# Patient Record
Sex: Male | Born: 1985 | Hispanic: Yes | Marital: Single | Smoking: Never smoker
Health system: Southern US, Community
[De-identification: ages and names within clinical notes are randomized; demographics above are authoritative.]

---

## 2022-02-02 ENCOUNTER — Emergency Department (HOSPITAL_COMMUNITY)
Admission: EM | Admit: 2022-02-02 | Discharge: 2022-02-02 | Disposition: A | Discharge status: Home / Self Care | Payer: Self-pay | Attending: Emergency Medicine | Admitting: Emergency Medicine

## 2022-02-02 ENCOUNTER — Emergency Department (HOSPITAL_COMMUNITY): Payer: Self-pay

## 2022-02-02 ENCOUNTER — Encounter (HOSPITAL_COMMUNITY): Payer: Self-pay

## 2022-02-02 ENCOUNTER — Other Ambulatory Visit: Payer: Self-pay

## 2022-02-02 DIAGNOSIS — Y93E5 Activity, floor mopping and cleaning: Secondary | ICD-10-CM | POA: Insufficient documentation

## 2022-02-02 DIAGNOSIS — W01198A Fall on same level from slipping, tripping and stumbling with subsequent striking against other object, initial encounter: Secondary | ICD-10-CM | POA: Insufficient documentation

## 2022-02-02 DIAGNOSIS — S62324A Displaced fracture of shaft of fourth metacarpal bone, right hand, initial encounter for closed fracture: Secondary | ICD-10-CM | POA: Insufficient documentation
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
## 2022-02-02 IMAGING — CR DG FOREARM 2V*R*
2 series · 2 of 2 positions shown · non-contrast
Comparison: None Available.

CLINICAL DATA: pain fall

EXAM:
RIGHT FOREARM - 2 VIEW

[forearm ap]
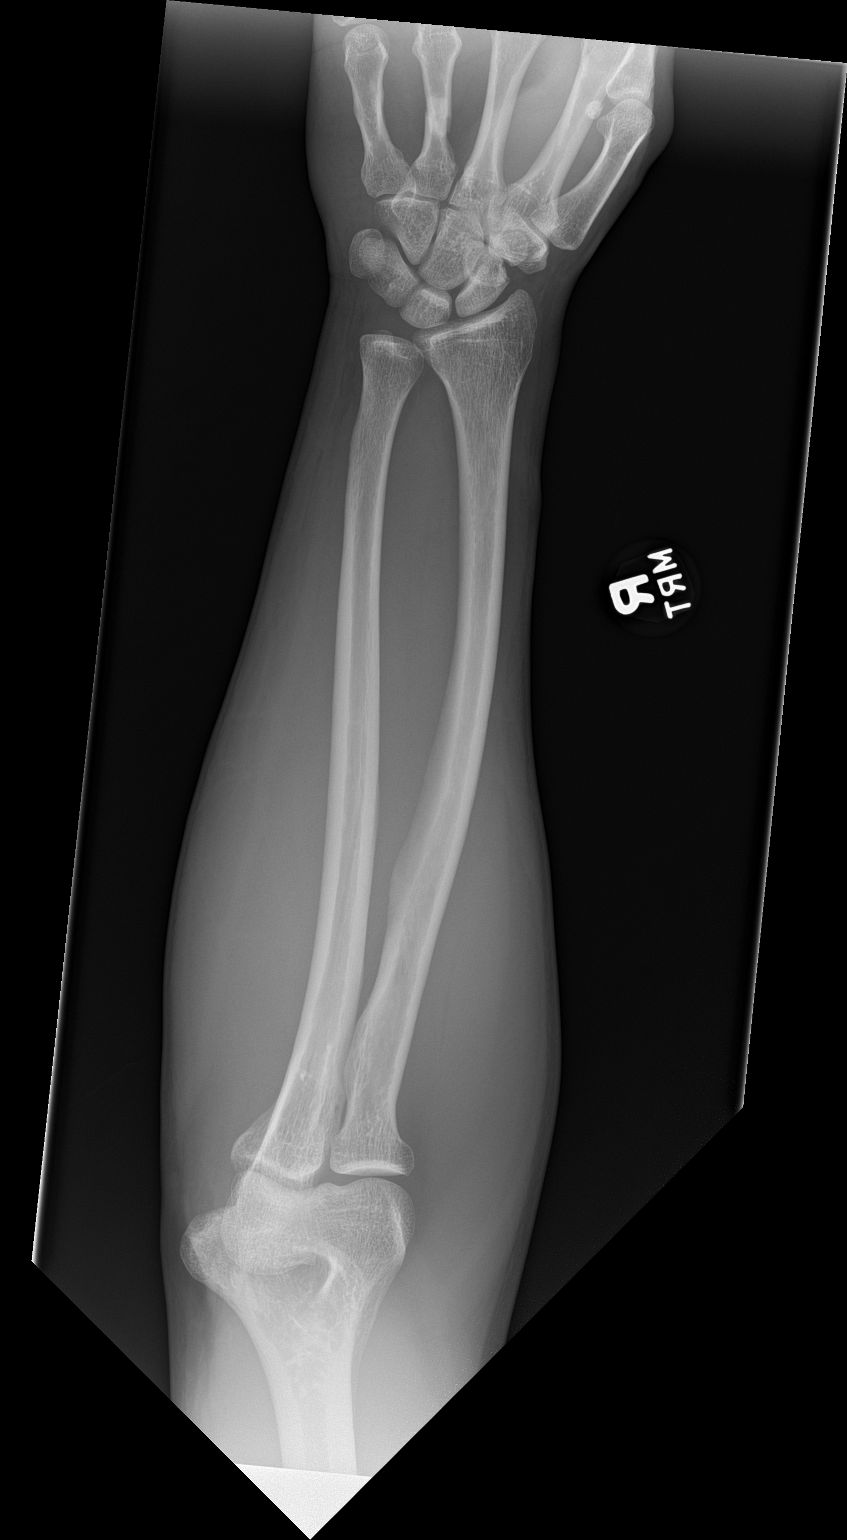

[forearm lat]
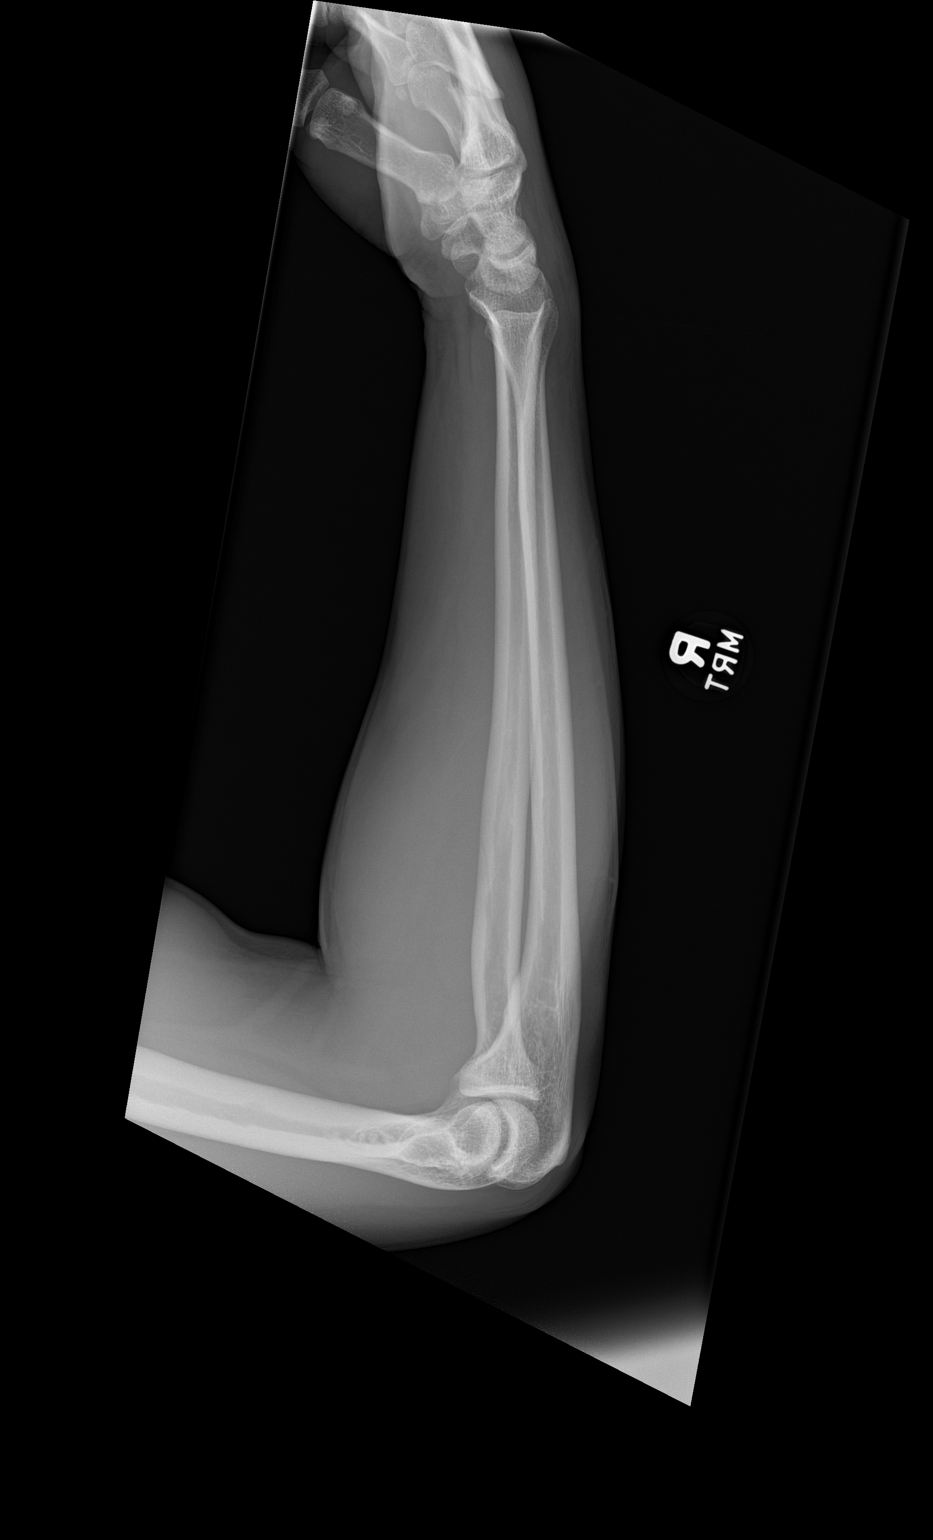

[2 of 2 positions shown; findings below may reference images not displayed]

FINDINGS: There is no evidence of fracture or other focal bone lesions in the
forearm. There is fracture of the fourth metacarpal with some
overlapping seen in the partially visualized hand. Soft tissues are
unremarkable.
IMPRESSION: Radius and ulna have a normal appearance. There is a fracture of the
fourth metacarpal.

## 2022-02-02 IMAGING — CR DG HAND COMPLETE 3+V*R*
3 series · 3 of 3 positions shown · non-contrast
Comparison: None Available.

CLINICAL DATA: Fall, right hand pain and swelling

EXAM:
RIGHT HAND - COMPLETE 3+ VIEW

[hand pa]
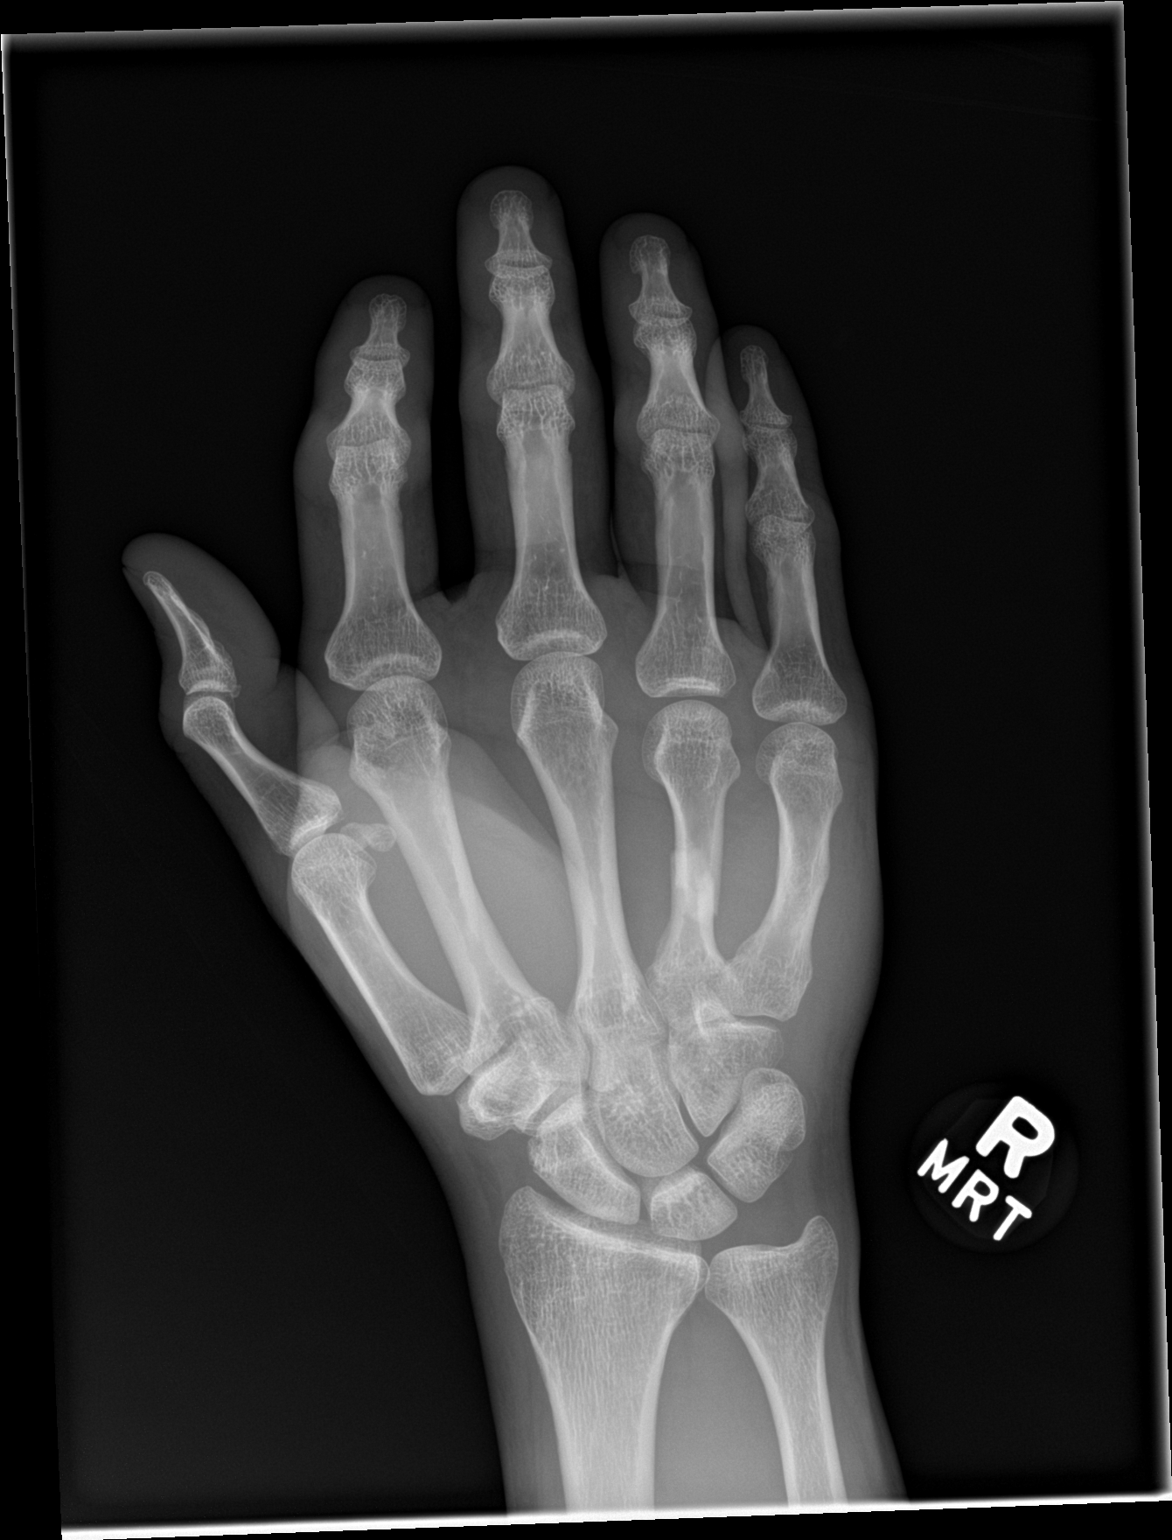

[hand obl]
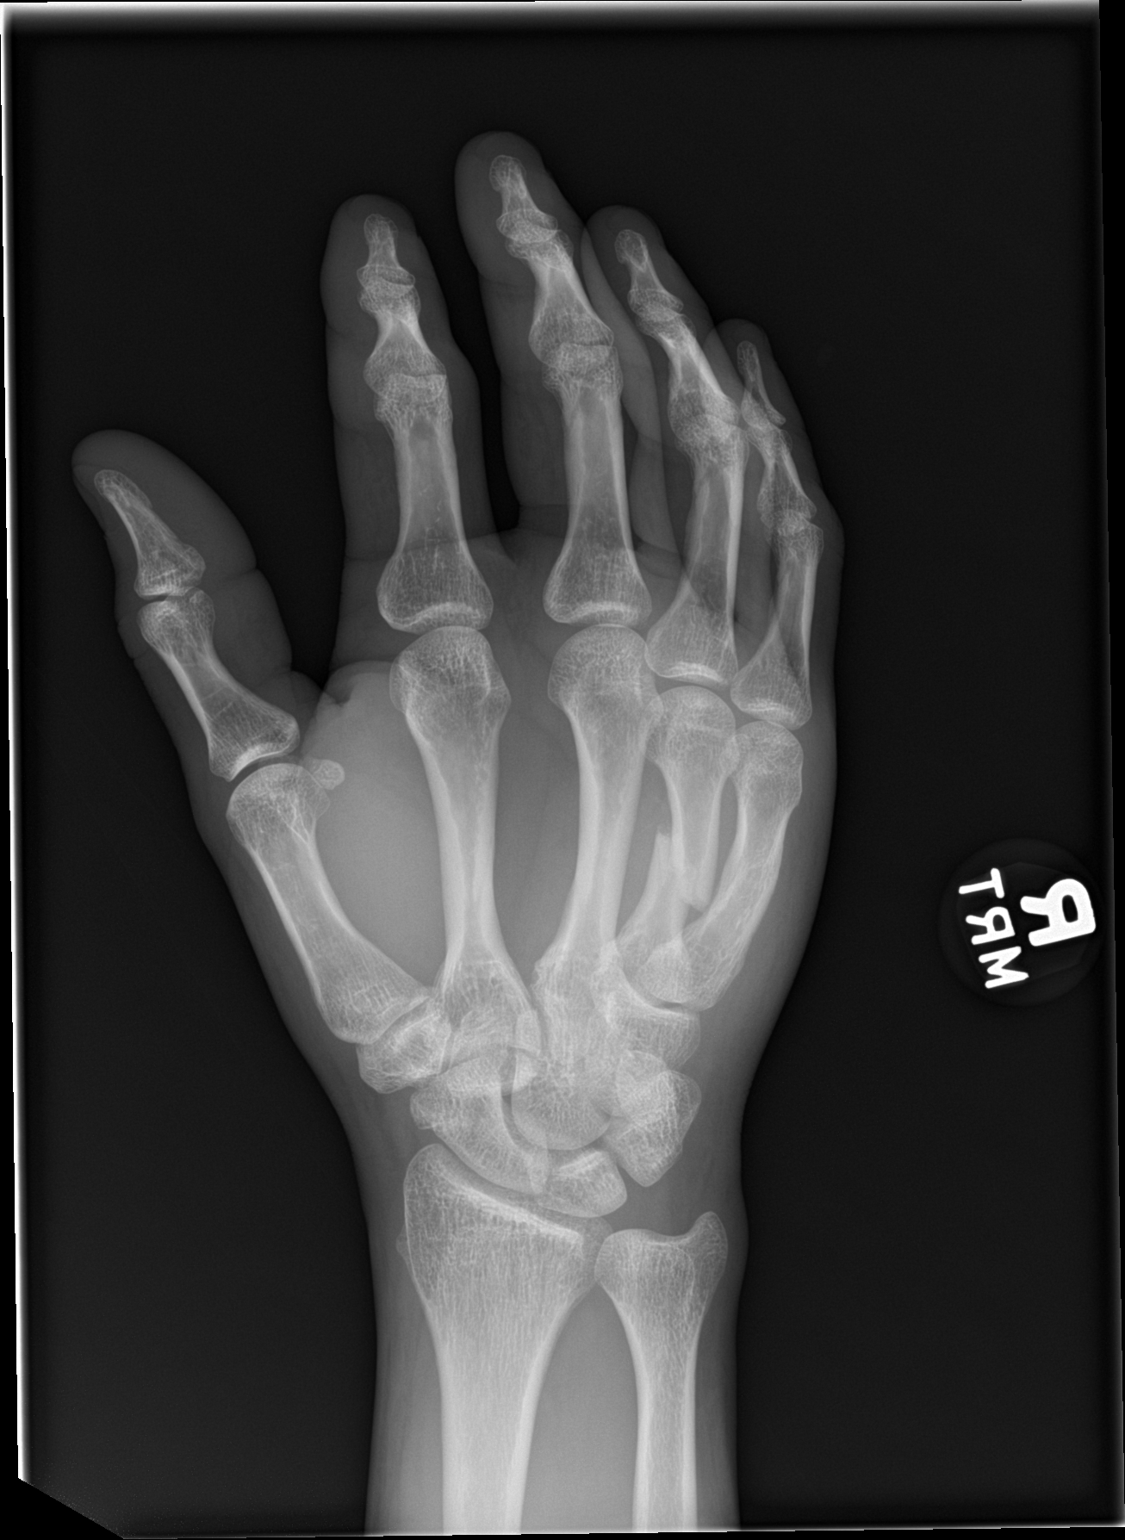

[hand lat]
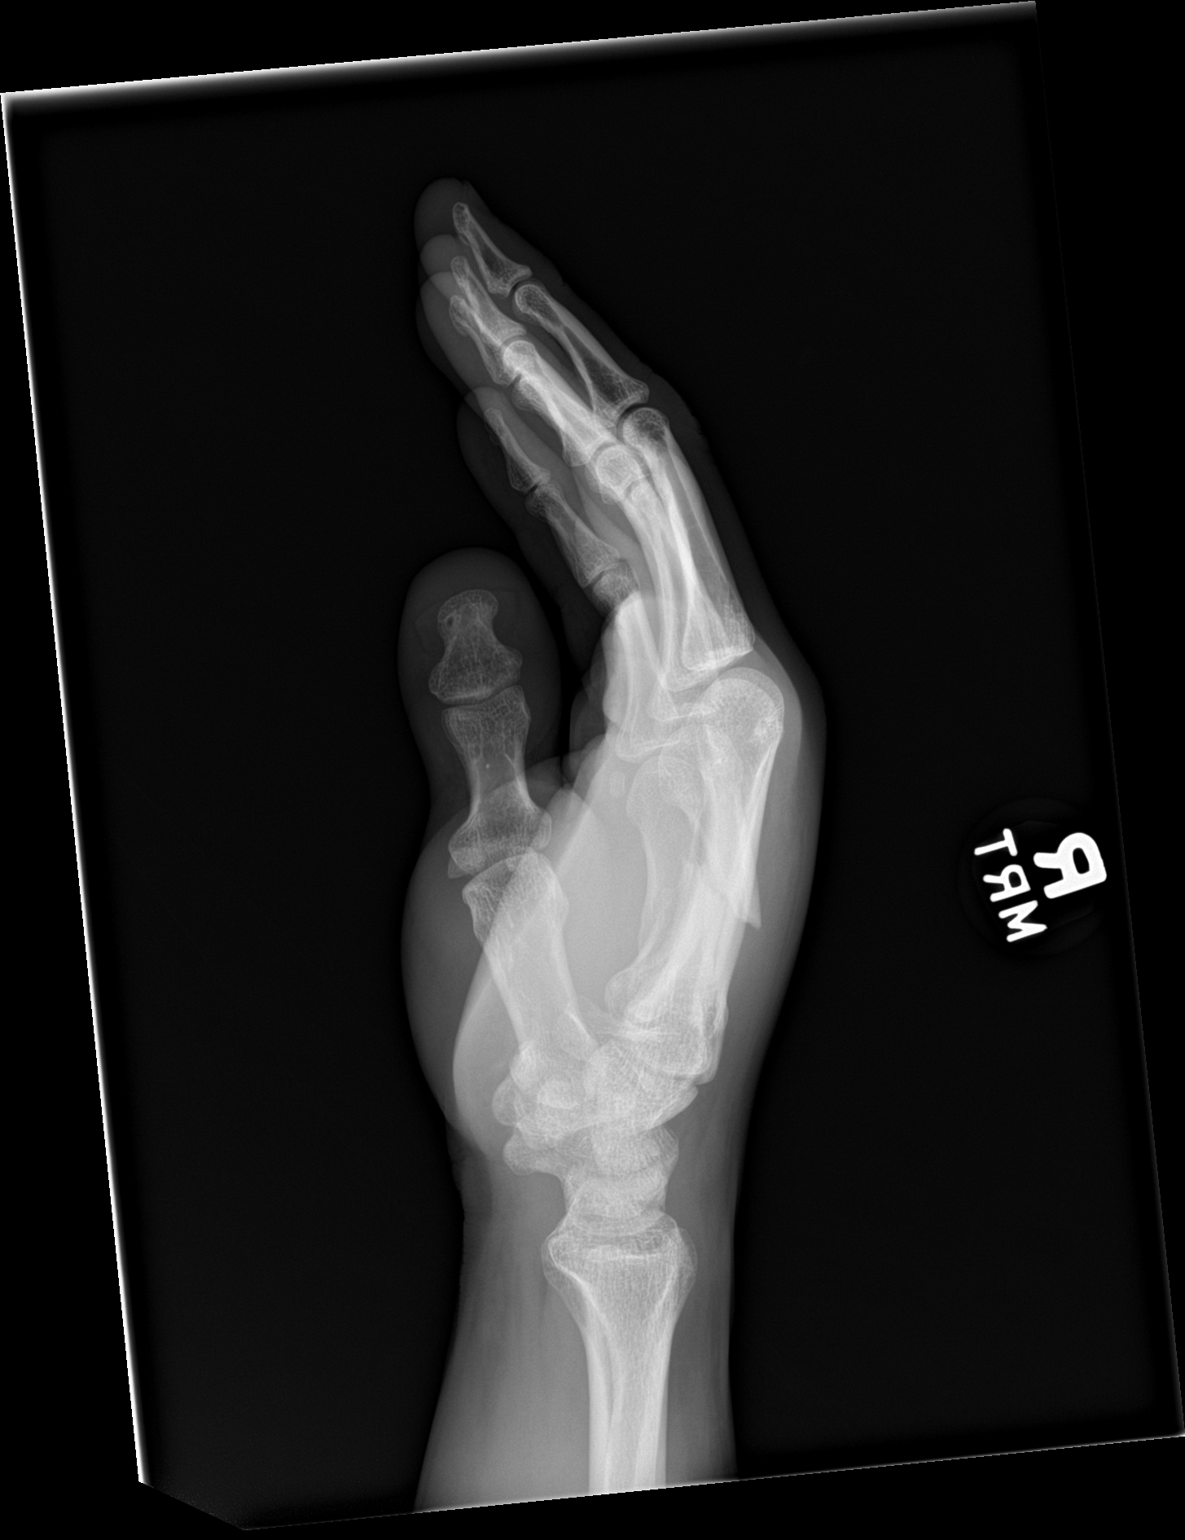

[3 of 3 positions shown; findings below may reference images not displayed]

FINDINGS: Fracture of the mid right fourth metacarpal, which is moderately
displaced and foreshortened. No additional fracture is seen. Soft
tissue swelling about the hand.
IMPRESSION: Fracture of the mid right fourth metacarpal, which is moderately
displaced and foreshortened.
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
## 2022-02-02 NOTE — Discharge Instructions (Addendum)
Return for any problem.  ? ?You have a fracture of the fourth metacarpal of your right hand.  This requires close follow-up with hand specialist to make sure that the bone heals correctly. ? ?The splint should remain in place until you can follow-up with a hand specialist (Dr. Lenon Curt). ? ?It is important to keep your splint dry.  Elevate your hand.  Take Tylenol for pain. ? ? ? ? ?

## 2022-02-02 NOTE — ED Triage Notes (Signed)
Pt arrived POV from home c/o a right wrist/hand injury. Pt states he was on the roof cleaning out the gutters and fell off onto his right arm and used his hand to catch himself. Pt has obvious swelling to right hand and bruising.  ?
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
## 2022-02-02 NOTE — ED Notes (Signed)
Ortho at hallway side  ?

## 2022-02-02 NOTE — ED Provider Triage Note (Signed)
Emergency Medicine Provider Triage Evaluation Note ? ?Joshua Orozco , a 36 y.o. male  was evaluated in triage.  Pt complains of head pain, neck pain and right hand pain after a fall earlier today.  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  Denies any loss of consciousness, anticoagulant use.  His pain is located on the dorsal side of the right hand with swelling. ? ?Review of Systems  ?Positive: Headache, neck pain, hand pain ?Negative: Loss Of consciousness ? ?Physical Exam  ?BP 114/71 (BP Location: Right Arm)   Pulse 70   Temp (!) 97.5 ?F (36.4 ?C) (Oral)   Resp 16   SpO2 96%  ?Gen:   Awake, no distress   ?Resp:  Normal effort  ?MSK:   Moves extremities without difficulty  ?Other:  Edema of the dorsum of the right hand.  2+ radial pulse palpated bilaterally.  Tenderness palpation of the cervical spine at the midline paraspinal musculature.  No facial asymmetry.  Moving all extremities without difficulty.  Normal gait. ? ?Medical Decision Making  ?Medically screening exam initiated at 2:57 PM.  Appropriate orders placed.  Joshua Orozco was informed that the remainder of the evaluation will be completed by another provider, this initial triage assessment does not replace that evaluation, and the importance of remaining in the ED until their evaluation is complete. ? ?Imaging ordered ?  ?Dietrich Pates, PA-C ?02/02/22 1506 ? ?

## 2022-02-22 ENCOUNTER — Inpatient Hospital Stay (HOSPITAL_COMMUNITY)
Admission: EM | Admit: 2022-02-22 | Discharge: 2022-03-07 | DRG: 958 | Disposition: A | Discharge status: Rehabilitation Facility | Payer: Self-pay | Attending: General Surgery | Admitting: General Surgery

## 2022-02-22 ENCOUNTER — Encounter (HOSPITAL_COMMUNITY): Payer: Self-pay

## 2022-02-22 ENCOUNTER — Emergency Department (HOSPITAL_COMMUNITY): Payer: Self-pay

## 2022-02-22 ENCOUNTER — Inpatient Hospital Stay (HOSPITAL_COMMUNITY): Payer: Self-pay

## 2022-02-22 ENCOUNTER — Other Ambulatory Visit: Payer: Self-pay

## 2022-02-22 DIAGNOSIS — S62396A Other fracture of fifth metacarpal bone, right hand, initial encounter for closed fracture: Secondary | ICD-10-CM | POA: Diagnosis present

## 2022-02-22 DIAGNOSIS — S3210XA Unspecified fracture of sacrum, initial encounter for closed fracture: Secondary | ICD-10-CM | POA: Diagnosis present

## 2022-02-22 DIAGNOSIS — F10129 Alcohol abuse with intoxication, unspecified: Secondary | ICD-10-CM | POA: Diagnosis present

## 2022-02-22 DIAGNOSIS — N501 Vascular disorders of male genital organs: Secondary | ICD-10-CM

## 2022-02-22 DIAGNOSIS — D62 Acute posthemorrhagic anemia: Secondary | ICD-10-CM | POA: Diagnosis present

## 2022-02-22 DIAGNOSIS — S7011XA Contusion of right thigh, initial encounter: Secondary | ICD-10-CM | POA: Diagnosis present

## 2022-02-22 DIAGNOSIS — S62394A Other fracture of fourth metacarpal bone, right hand, initial encounter for closed fracture: Secondary | ICD-10-CM | POA: Diagnosis present

## 2022-02-22 DIAGNOSIS — S32592A Other specified fracture of left pubis, initial encounter for closed fracture: Secondary | ICD-10-CM | POA: Diagnosis present

## 2022-02-22 DIAGNOSIS — T1490XA Injury, unspecified, initial encounter: Principal | ICD-10-CM

## 2022-02-22 DIAGNOSIS — S3729XA Other injury of bladder, initial encounter: Secondary | ICD-10-CM | POA: Diagnosis present

## 2022-02-22 DIAGNOSIS — S32810A Multiple fractures of pelvis with stable disruption of pelvic ring, initial encounter for closed fracture: Secondary | ICD-10-CM

## 2022-02-22 DIAGNOSIS — N3289 Other specified disorders of bladder: Secondary | ICD-10-CM | POA: Diagnosis present

## 2022-02-22 DIAGNOSIS — S6291XA Unspecified fracture of right wrist and hand, initial encounter for closed fracture: Secondary | ICD-10-CM

## 2022-02-22 DIAGNOSIS — Y92410 Unspecified street and highway as the place of occurrence of the external cause: Secondary | ICD-10-CM

## 2022-02-22 LAB — COMPREHENSIVE METABOLIC PANEL
ALT: 19 U/L (ref 0–44)
AST: 33 U/L (ref 15–41)
Albumin: 4.7 g/dL (ref 3.5–5.0)
Alkaline Phosphatase: 62 U/L (ref 38–126)
Anion gap: 11 (ref 5–15)
BUN: 7 mg/dL (ref 6–20)
CO2: 22 mmol/L (ref 22–32)
Calcium: 9.3 mg/dL (ref 8.9–10.3)
Chloride: 105 mmol/L (ref 98–111)
Creatinine, Ser: 0.83 mg/dL (ref 0.61–1.24)
GFR, Estimated: >60 mL/min (ref >60)
Glucose, Bld: 125 mg/dL — ABNORMAL HIGH (ref 70–99)
Potassium: 4 mmol/L (ref 3.5–5.1)
Sodium: 138 mmol/L (ref 135–145)
Total Bilirubin: 0.6 mg/dL (ref 0.3–1.2)
Total Protein: 7.2 g/dL (ref 6.5–8.1)

## 2022-02-22 LAB — URINALYSIS, ROUTINE W REFLEX MICROSCOPIC
Bilirubin Urine: NEGATIVE
Glucose, UA: NEGATIVE mg/dL
Hgb urine dipstick: NEGATIVE
Ketones, ur: 5 mg/dL — AB
Leukocytes,Ua: NEGATIVE
Nitrite: NEGATIVE
Protein, ur: NEGATIVE mg/dL
Specific Gravity, Urine: >1.046 — ABNORMAL HIGH (ref 1.005–1.030)
pH: 5 (ref 5.0–8.0)

## 2022-02-22 LAB — MRSA NEXT GEN BY PCR, NASAL: MRSA by PCR Next Gen: NOT DETECTED

## 2022-02-22 LAB — I-STAT CHEM 8, ED
BUN: 7 mg/dL (ref 6–20)
Calcium, Ion: 1.15 mmol/L (ref 1.15–1.40)
Chloride: 103 mmol/L (ref 98–111)
Creatinine, Ser: 0.9 mg/dL (ref 0.61–1.24)
Glucose, Bld: 123 mg/dL — ABNORMAL HIGH (ref 70–99)
HCT: 43 % (ref 39.0–52.0)
Hemoglobin: 14.6 g/dL (ref 13.0–17.0)
Potassium: 4 mmol/L (ref 3.5–5.1)
Sodium: 140 mmol/L (ref 135–145)
TCO2: 22 mmol/L (ref 22–32)

## 2022-02-22 LAB — ETHANOL: Alcohol, Ethyl (B): 129 mg/dL — ABNORMAL HIGH (ref <10)

## 2022-02-22 LAB — CBC
HCT: 40.8 % (ref 39.0–52.0)
Hemoglobin: 14.3 g/dL (ref 13.0–17.0)
MCH: 31.2 pg (ref 26.0–34.0)
MCHC: 35 g/dL (ref 30.0–36.0)
MCV: 88.9 fL (ref 80.0–100.0)
Platelets: 189 10*3/uL (ref 150–400)
RBC: 4.59 MIL/uL (ref 4.22–5.81)
RDW: 12.5 % (ref 11.5–15.5)
WBC: 13.3 10*3/uL — ABNORMAL HIGH (ref 4.0–10.5)
nRBC: 0 % (ref 0.0–0.2)

## 2022-02-22 LAB — PROTIME-INR
INR: 1 (ref 0.8–1.2)
Prothrombin Time: 13.1 seconds (ref 11.4–15.2)

## 2022-02-22 LAB — SAMPLE TO BLOOD BANK

## 2022-02-22 LAB — HIV ANTIBODY (ROUTINE TESTING W REFLEX): HIV Screen 4th Generation wRfx: NONREACTIVE
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
## 2022-02-22 IMAGING — MR MR THORACIC SPINE W/O CM
4 of 6 series · 20 of 48 positions shown · non-contrast
Comparison: Prior CT from earlier the same day.

CLINICAL DATA: Initial evaluation for acute back trauma, abnormal
neuro exam.

EXAM:
MRI THORACIC SPINE WITHOUT CONTRAST
TECHNIQUE: Multiplanar, multisequence MR imaging of the thoracic spine was
performed. No intravenous contrast was administered.

[Series 18: T1 · sagittal · 3.3mm · 0.62mm/px · 3 of 9 slices shown (1 of 2)]
[im 1/9]
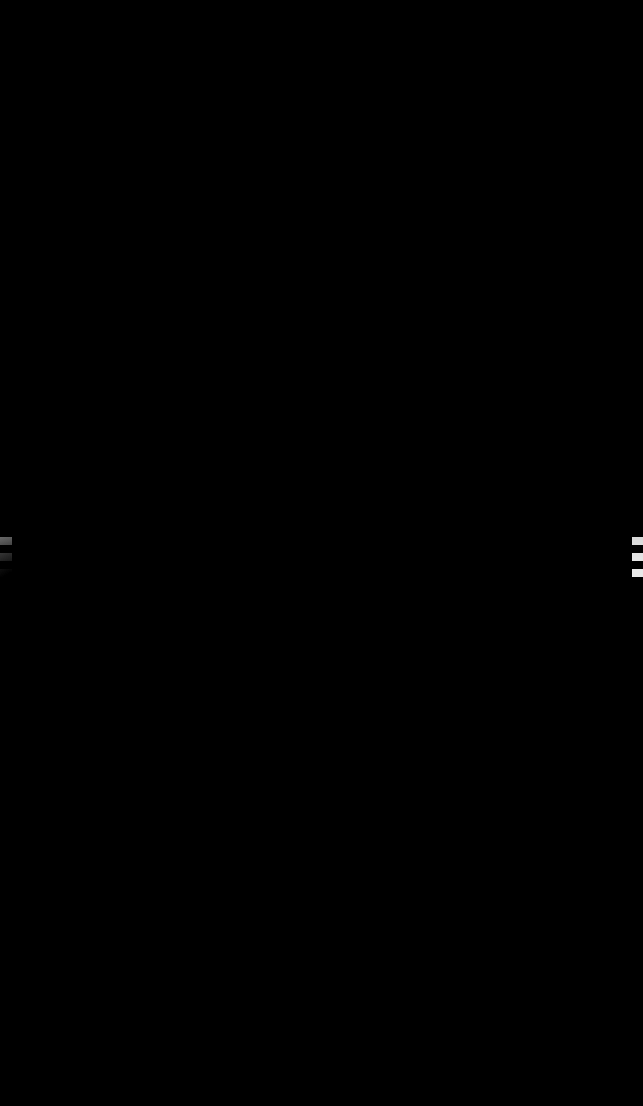
[im 6/9]
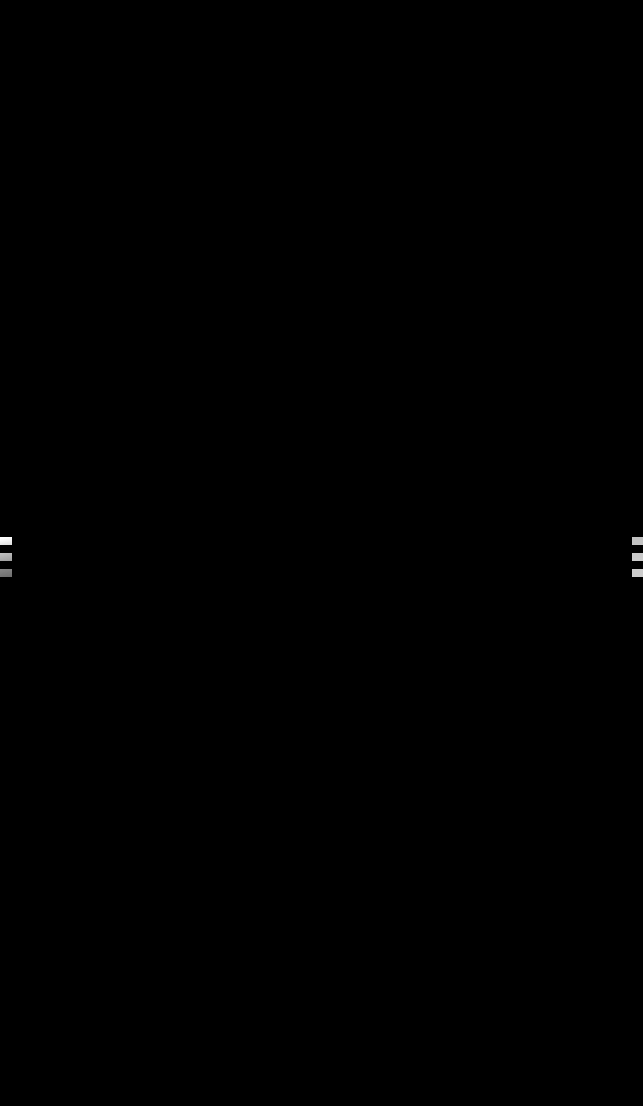
[im 9/9]
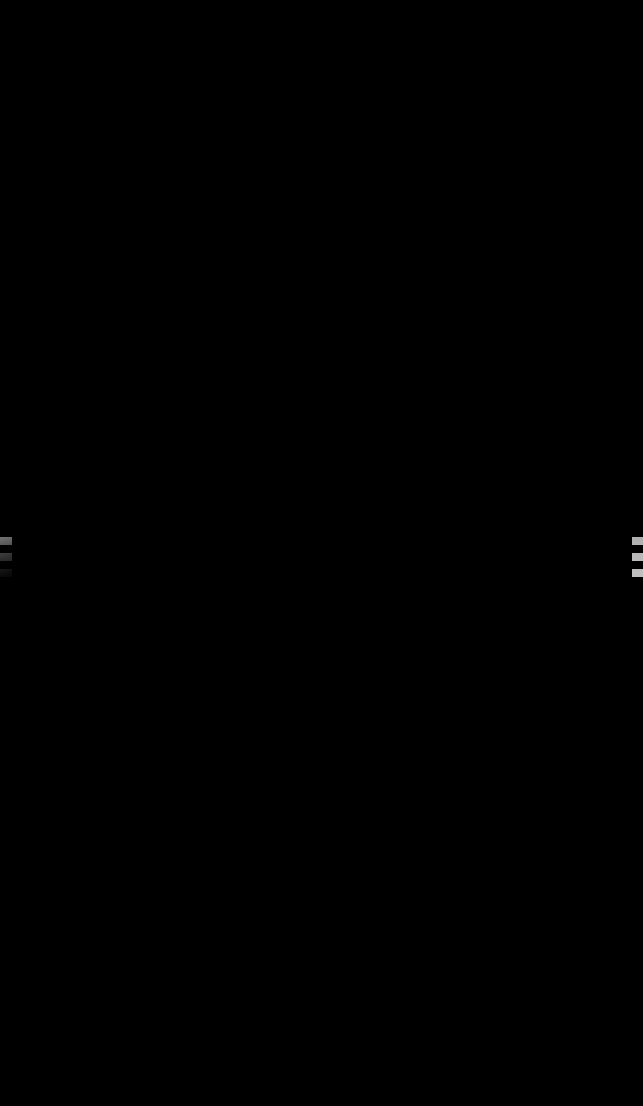

[Series 19: T2 · sagittal · 3.0mm · 0.76mm/px · 6 of 15 slices shown (1 of 2)]
[im 1/15]
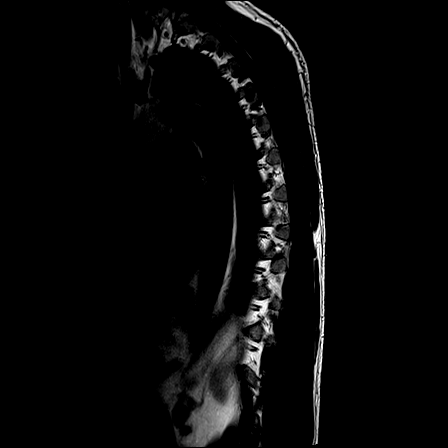
[im 3/15]
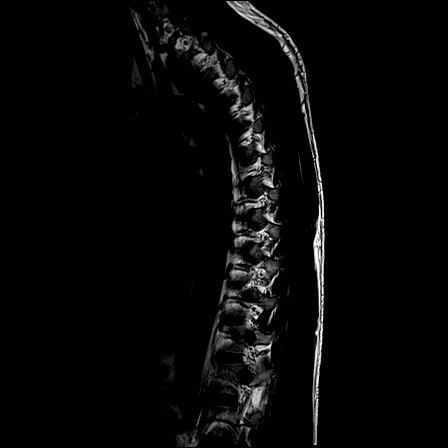
[im 6/15]
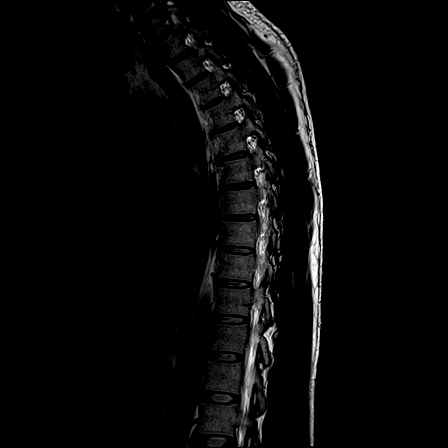
[im 9/15]
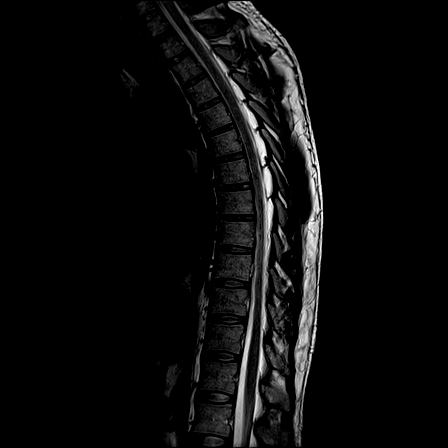
[im 12/15]
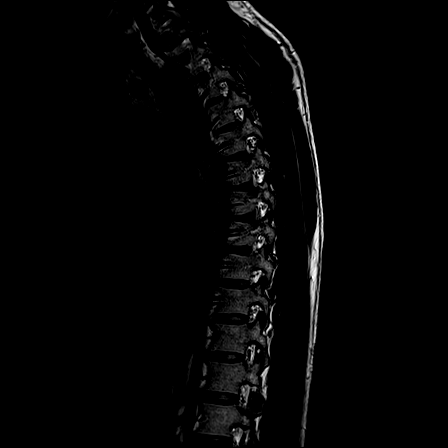
[im 15/15]
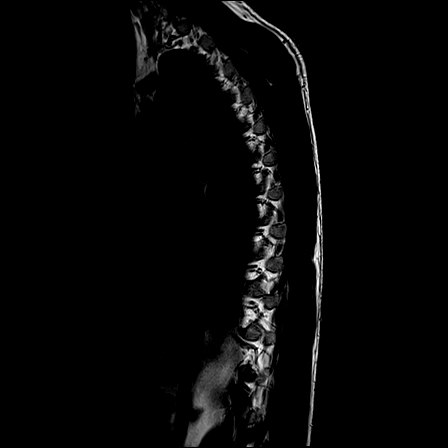

[Series 20: T1 · sagittal · 3.0mm · 0.76mm/px · 3 of 15 slices shown (2 of 2)]
[im 1/15]
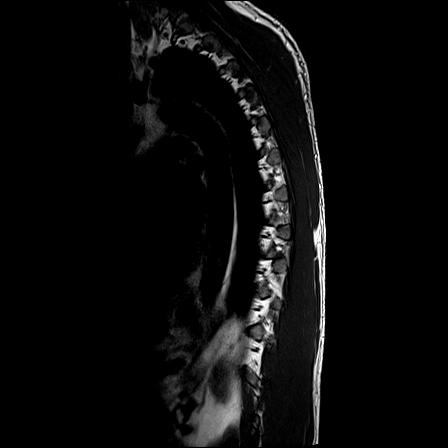
[im 8/15]
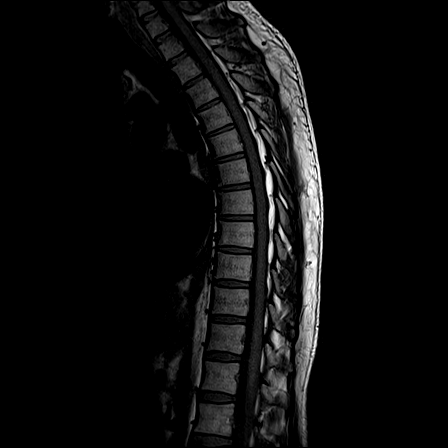
[im 15/15]
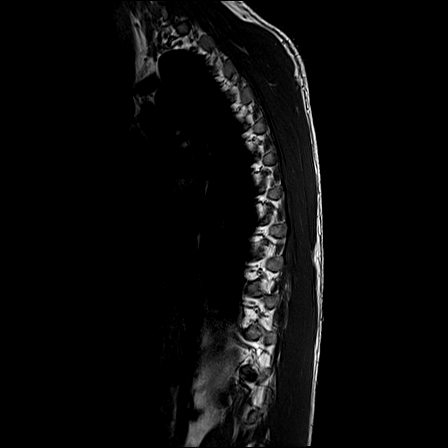

[Series 22: T2 · axial · 5.0mm · 0.59mm/px · z∈[-326,-98]mm · 8 of 39 slices shown (2 of 2)]
[im 1/39]
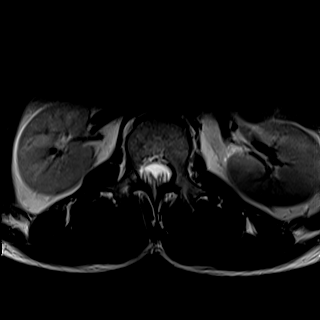
[im 6/39]
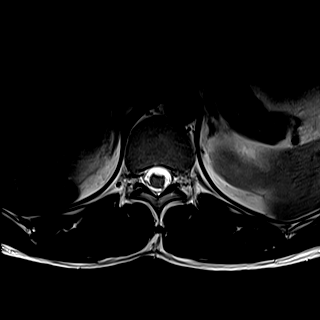
[im 12/39]
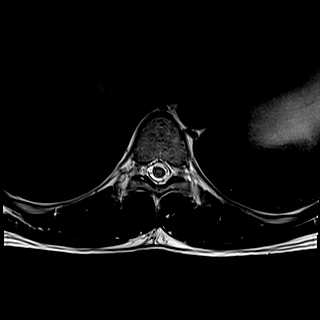
[im 18/39]
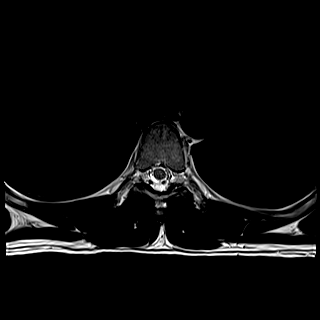
[im 21/39]
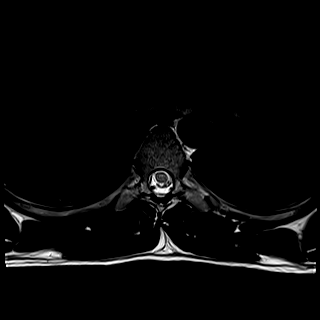
[im 27/39]
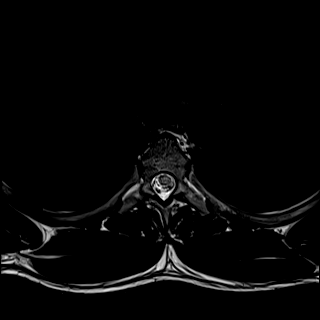
[im 33/39]
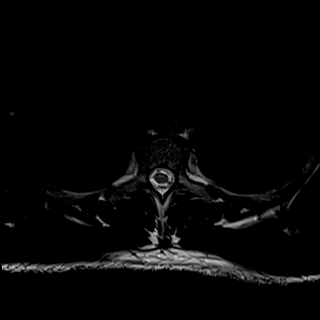
[im 39/39]
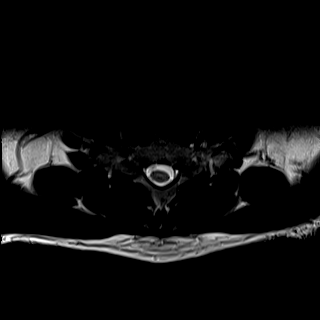

[20 of 48 positions shown; findings below may reference images not displayed]

FINDINGS: Alignment: Physiologic with preservation of the normal thoracic
kyphosis. No listhesis.

Vertebrae: Vertebral body height maintained without acute or chronic
fracture. Bone marrow signal intensity within normal limits. No
discrete or worrisome osseous lesions. No abnormal marrow edema.

Cord: Normal signal and morphology. No epidural collections. No
evidence for ligamentous injury.

Paraspinal and other soft tissues: Paraspinous soft tissues
demonstrate no acute finding. Subcentimeter simple cyst noted within
the left kidney, benign in appearance, no follow-up imaging
recommended.

Disc levels:

No significant disc pathology.  No stenosis or neural impingement.
IMPRESSION: Normal MRI of the thoracic spine. No evidence for acute traumatic
injury.
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
## 2022-02-22 IMAGING — MR MR LUMBAR SPINE W/O CM
4 of 5 series · 28 of 48 positions shown · non-contrast
Comparison: Prior CT from earlier the same day.

CLINICAL DATA: Initial evaluation for acute trauma, abnormal neuro
exam.

EXAM:
MRI LUMBAR SPINE WITHOUT CONTRAST
TECHNIQUE: Multiplanar, multisequence MR imaging of the lumbar spine was
performed. No intravenous contrast was administered.

[Series 1: T2 · sagittal · 4.0mm · 0.73mm/px · 8 of 15 slices shown (1 of 2)]
[im 1/15]
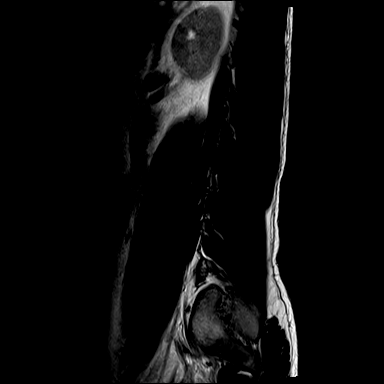
[im 3/15]
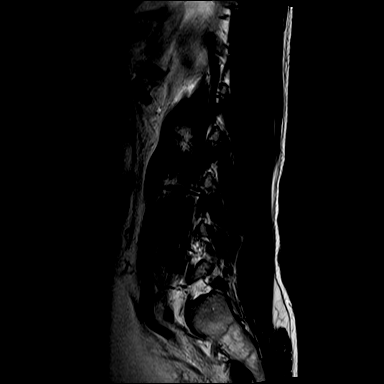
[im 5/15]
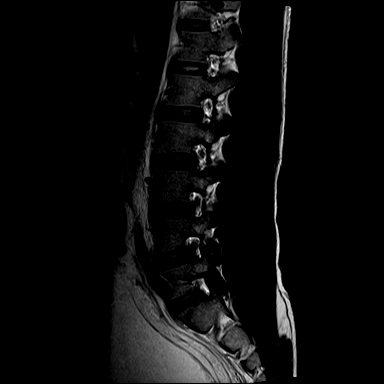
[im 7/15]
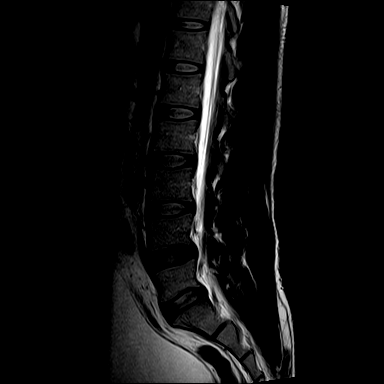
[im 9/15]
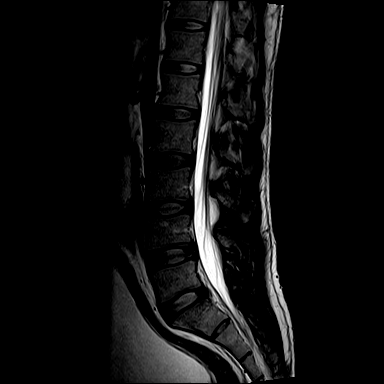
[im 11/15]
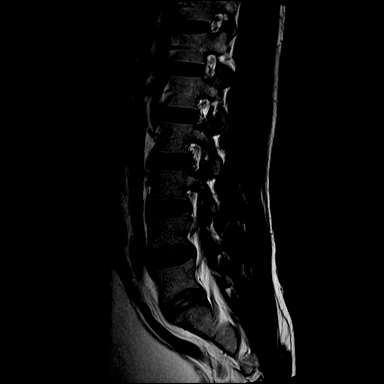
[im 13/15]
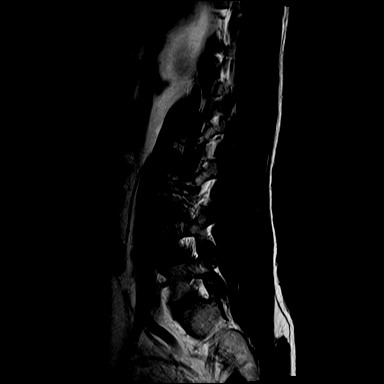
[im 15/15]
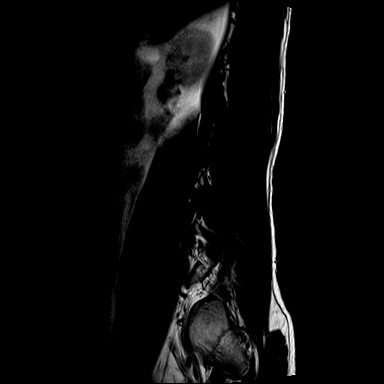

[Series 3: T1 · sagittal · 4.0mm · 0.88mm/px · 7 of 15 slices shown (1 of 2)]
[im 1/15]
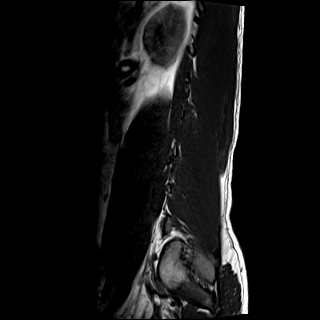
[im 3/15]
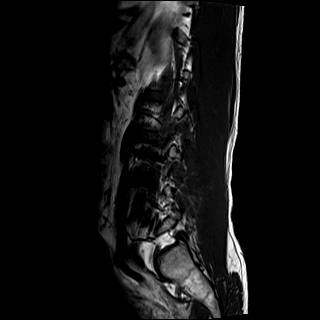
[im 5/15]
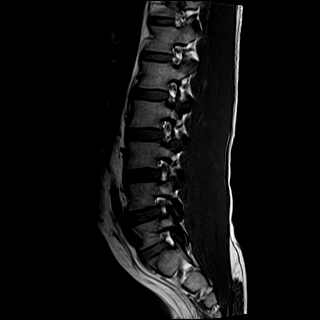
[im 8/15]
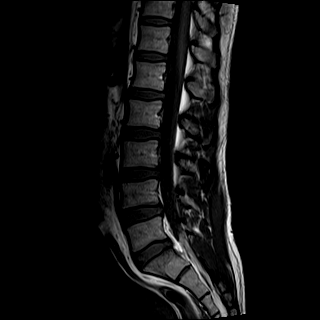
[im 10/15]
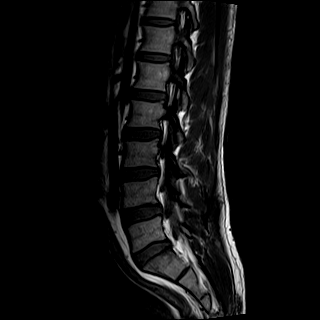
[im 12/15]
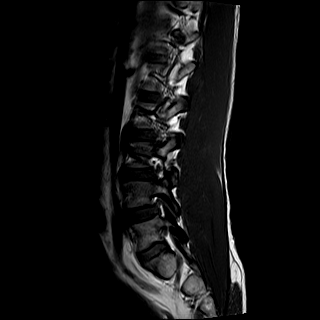
[im 15/15]
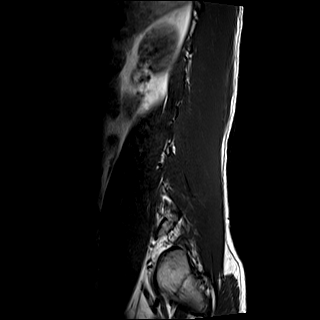

[Series 4: T2 · axial · 5.0mm · 0.57mm/px · z∈[-510,-315]mm · 9 of 26 slices shown (2 of 2)]
[im 1/26]
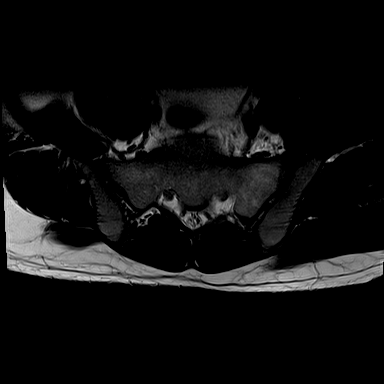
[im 5/26]
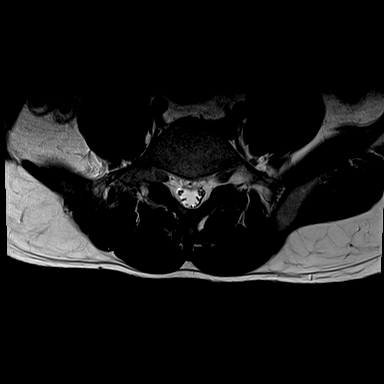
[im 9/26]
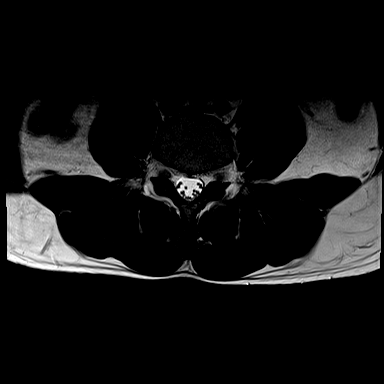
[im 11/26]
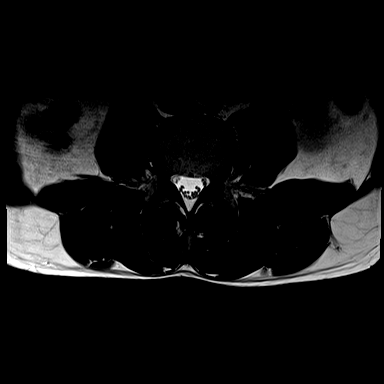
[im 13/26]
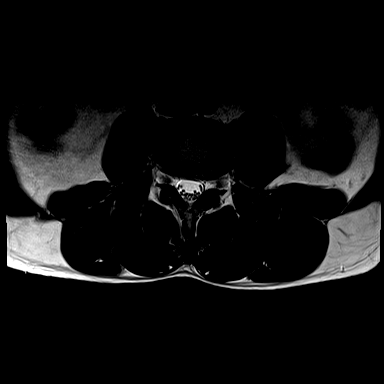
[im 15/26]
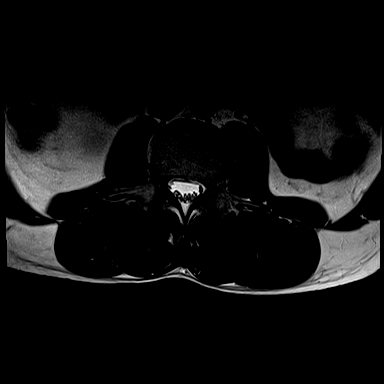
[im 17/26]
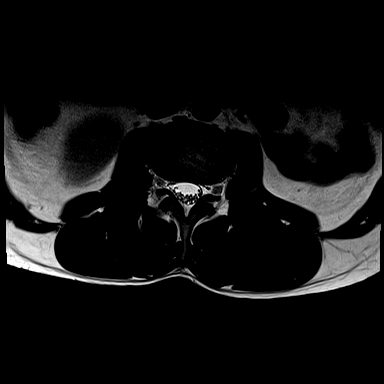
[im 21/26]
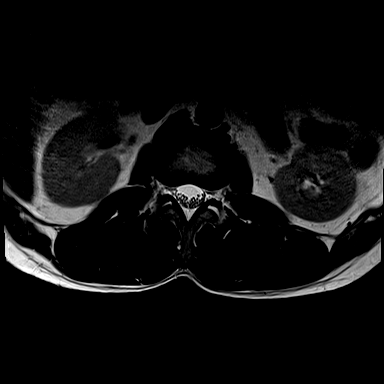
[im 26/26]
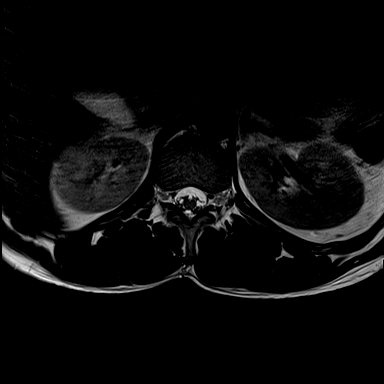

[Series 5: T1 · axial · 5.0mm · 0.34mm/px · z∈[-510,-352]mm · 4 of 26 slices shown (2 of 2)]
[im 1/26]
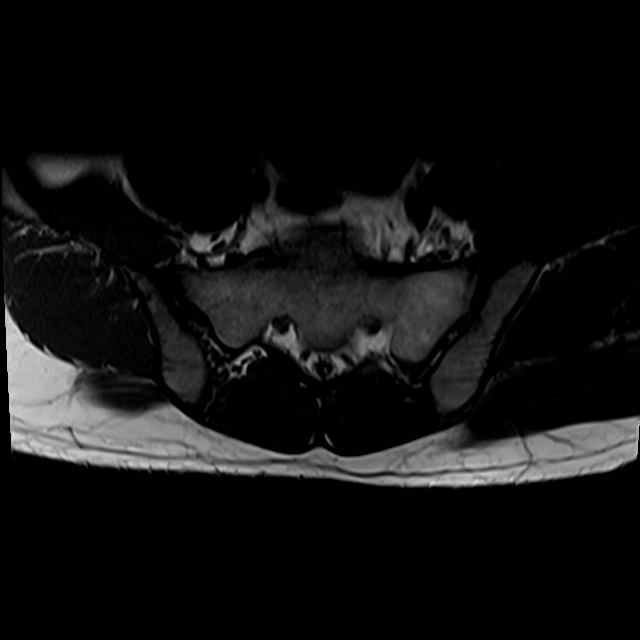
[im 5/26]
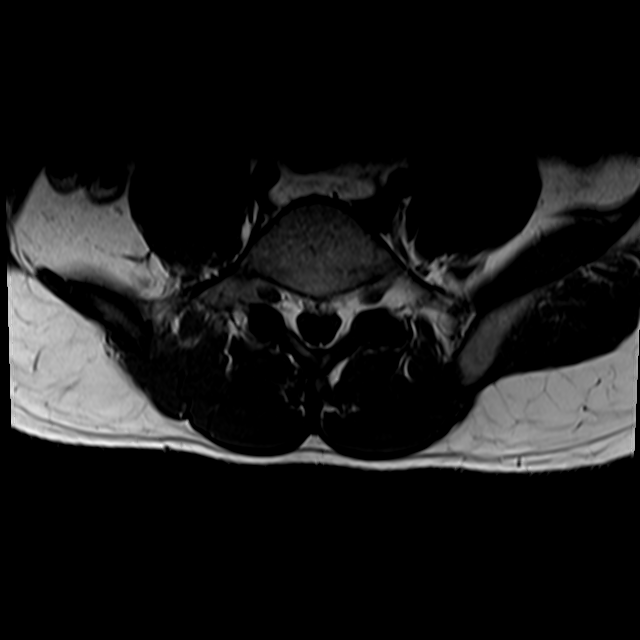
[im 13/26]
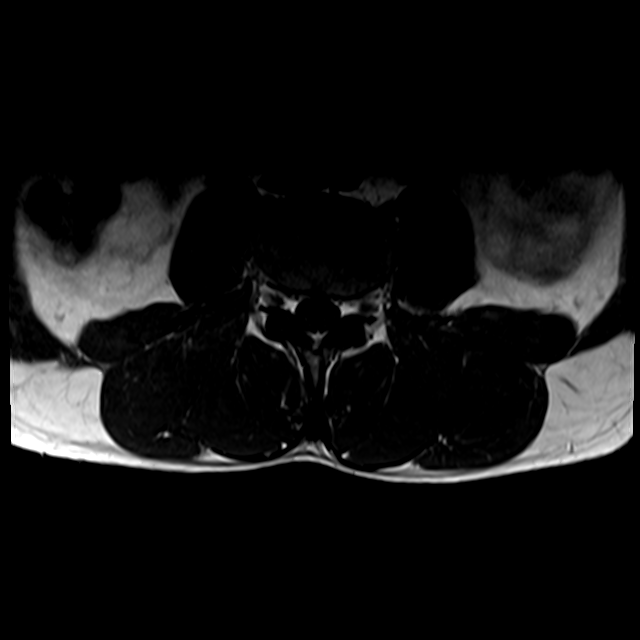
[im 21/26]
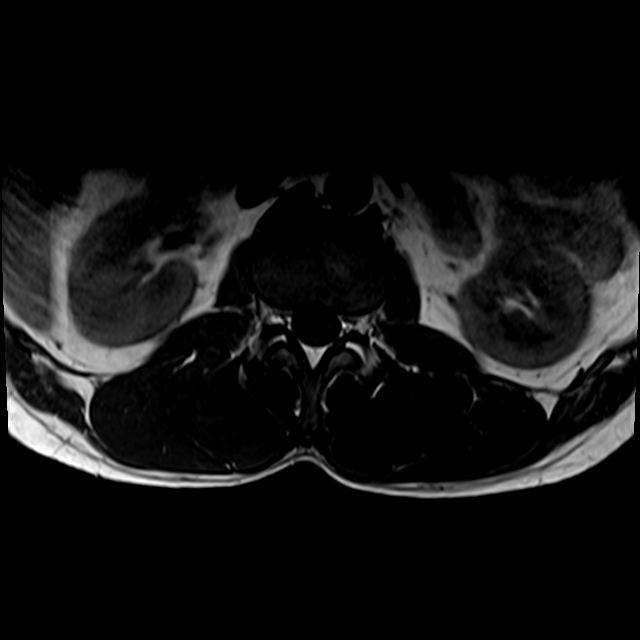

[28 of 48 positions shown; findings below may reference images not displayed]

FINDINGS: Segmentation: Standard. Lowest well-formed disc space labeled the
L5-S1 level.

Alignment: Physiologic with preservation of the normal lumbar
lordosis. No significant listhesis.

Vertebrae: Subtle acute nondisplaced fracture extending through the
mid sacrum with associated marrow edema, better appreciated on prior
CT. Additionally, there are acute nondisplaced fractures of the
bilateral transverse processes of L5, also seen on prior CT in
retrospect. Vertebral body height maintained with no other acute or
chronic fracture. Underlying bone marrow signal intensity within
normal limits. No discrete or worrisome osseous lesions. No other
abnormal marrow edema.

Conus medullaris and cauda equina: Conus extends to the L1 level.
Conus and cauda equina appear normal. Incidental note made of a tiny
fatty filum terminale.

Paraspinal and other soft tissues: Mild edema adjacent to the acute
L5 transverse process fractures. Paraspinous soft tissues
demonstrate no other acute finding. Prominent distension of the
partially visualized urinary bladder. Visualized visceral structures
otherwise unremarkable.

Disc levels:

L1-2:  Unremarkable.

L2-3:  Unremarkable.

L3-4:  Unremarkable.

L4-5: Mild disc bulge. No spinal stenosis. Foramina remain patent.

L5-S1:  Unremarkable.
IMPRESSION: 1. Acute nondisplaced fracture extending through the mid sacrum,
better appreciated on prior CT.
2. Acute nondisplaced fractures of the bilateral transverse
processes of L5, also seen on prior CT in retrospect.
3. No other acute traumatic injury within the lumbar spine. No
significant stenosis or neural impingement.
4. Prominent distension of the partially visualized urinary bladder.
Clinical correlation for possible urinary retention recommended.
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
## 2022-02-22 MED ORDER — METHOCARBAMOL 500 MG PO TABS
500.0000 mg | ORAL_TABLET | Freq: Three times a day (TID) | ORAL | Status: DC | PRN
Start: 2022-02-22 — End: 2022-02-23
  Administered 2022-02-22: 500 mg via ORAL
  Filled 2022-02-22: qty 1

## 2022-02-22 MED ORDER — MORPHINE SULFATE (PF) 4 MG/ML IV SOLN
4.0000 mg | INTRAVENOUS | Status: DC | PRN
  Administered 2022-02-22: 4 mg via INTRAVENOUS
  Filled 2022-02-22 (×2): qty 1

## 2022-02-22 MED ORDER — HYDROMORPHONE HCL 1 MG/ML IJ SOLN
1.0000 mg | INTRAMUSCULAR | Status: DC | PRN
  Administered 2022-02-22: 1 mg via INTRAVENOUS
  Filled 2022-02-22 (×2): qty 1

## 2022-02-22 MED ORDER — OXYCODONE HCL 5 MG PO TABS
5.0000 mg | ORAL_TABLET | ORAL | Status: DC | PRN
  Administered 2022-02-22 – 2022-02-25 (×9): 10 mg via ORAL
  Filled 2022-02-22 (×10): qty 2

## 2022-02-22 MED ORDER — METOPROLOL TARTRATE 5 MG/5ML IV SOLN: 5.0000 mg | Freq: Four times a day (QID) | INTRAVENOUS | Status: DC | PRN

## 2022-02-22 MED ORDER — DOCUSATE SODIUM 100 MG PO CAPS
100.0000 mg | ORAL_CAPSULE | Freq: Two times a day (BID) | ORAL | Status: DC
  Administered 2022-02-22 – 2022-03-05 (×19): 100 mg via ORAL
  Filled 2022-02-22 (×21): qty 1

## 2022-02-22 MED ORDER — IOHEXOL 300 MG/ML  SOLN
100.0000 mL | Freq: Once | INTRAMUSCULAR | Status: AC | PRN
  Administered 2022-02-22: 100 mL via INTRAVENOUS

## 2022-02-22 MED ORDER — ONDANSETRON 4 MG PO TBDP: 4.0000 mg | ORAL_TABLET | Freq: Four times a day (QID) | ORAL | Status: DC | PRN

## 2022-02-22 MED ORDER — DEXTROSE 5 % IV SOLN
500.0000 mg | Freq: Three times a day (TID) | INTRAVENOUS | Status: DC | PRN
  Filled 2022-02-22: qty 5

## 2022-02-22 MED ORDER — FENTANYL CITRATE PF 50 MCG/ML IJ SOSY
25.0000 ug | PREFILLED_SYRINGE | Freq: Once | INTRAMUSCULAR | Status: AC
  Administered 2022-02-22: 25 ug via INTRAVENOUS
  Filled 2022-02-22: qty 1

## 2022-02-22 MED ORDER — LACTATED RINGERS IV SOLN: INTRAVENOUS | Status: DC

## 2022-02-22 MED ORDER — THIAMINE HCL 100 MG PO TABS
100.0000 mg | ORAL_TABLET | Freq: Every day | ORAL | Status: DC
  Administered 2022-02-22 – 2022-03-07 (×13): 100 mg via ORAL
  Filled 2022-02-22 (×13): qty 1

## 2022-02-22 MED ORDER — LORAZEPAM 2 MG/ML IJ SOLN: 1.0000 mg | INTRAMUSCULAR | Status: AC | PRN

## 2022-02-22 MED ORDER — ADULT MULTIVITAMIN W/MINERALS CH
1.0000 | ORAL_TABLET | Freq: Every day | ORAL | Status: DC
  Administered 2022-02-22 – 2022-03-07 (×13): 1 via ORAL
  Filled 2022-02-22 (×13): qty 1

## 2022-02-22 MED ORDER — LORAZEPAM 1 MG PO TABS: 1.0000 mg | ORAL_TABLET | ORAL | Status: AC | PRN

## 2022-02-22 MED ORDER — ACETAMINOPHEN 500 MG PO TABS
1000.0000 mg | ORAL_TABLET | Freq: Four times a day (QID) | ORAL | Status: DC
  Administered 2022-02-22 – 2022-03-03 (×33): 1000 mg via ORAL
  Filled 2022-02-22 (×32): qty 2

## 2022-02-22 MED ORDER — FOLIC ACID 1 MG PO TABS
1.0000 mg | ORAL_TABLET | Freq: Every day | ORAL | Status: DC
  Administered 2022-02-22 – 2022-03-07 (×13): 1 mg via ORAL
  Filled 2022-02-22 (×12): qty 1

## 2022-02-22 MED ORDER — ONDANSETRON HCL 4 MG/2ML IJ SOLN: 4.0000 mg | Freq: Four times a day (QID) | INTRAMUSCULAR | Status: DC | PRN

## 2022-02-22 MED ORDER — THIAMINE HCL 100 MG/ML IJ SOLN
100.0000 mg | Freq: Every day | INTRAMUSCULAR | Status: DC
  Administered 2022-02-24: 100 mg via INTRAVENOUS
  Filled 2022-02-22: qty 1

## 2022-02-22 NOTE — Progress Notes (Signed)
Orthopedic Tech Progress Note Patient Details:  Joshua Orozco [birth year] [id number]  Ortho Devices Type of Ortho Device: Ulna gutter splint Ortho Device/Splint Location: RUE Ortho Device/Splint Interventions: Ordered, Application, Adjustment   Post Interventions Patient Tolerated: Well  Darleen Crocker 02/22/2022, 11:31 AM

## 2022-02-22 NOTE — ED Notes (Signed)
Patient starting complaining of decreased sensation to right leg.  Patient can feel me touch left leg but not right.  Patient still able to wiggle bilateral toes.
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
## 2022-02-22 NOTE — Progress Notes (Signed)
MD paged to request diet. Awaiting response.

## 2022-02-22 NOTE — Progress Notes (Signed)
Pt off the unit to MRI
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
## 2022-02-22 NOTE — ED Notes (Signed)
Placed on cardiac monitoring, pulse ox and continuous BP monitoring due to critical xray results.
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
## 2022-02-22 NOTE — ED Notes (Signed)
Patient transported to CT 

## 2022-02-22 NOTE — ED Notes (Signed)
HOB elevated to 30 degrees per T PA.
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
## 2022-02-22 NOTE — Progress Notes (Addendum)
Pt returned from MRI. VS and assessment documented. Pain 10/10 to right hip (see eMAR).

## 2022-02-22 NOTE — ED Notes (Signed)
Patient voided without difficulty no visible blood in urine.

## 2022-02-23 LAB — BASIC METABOLIC PANEL
Anion gap: 6 (ref 5–15)
BUN: 8 mg/dL (ref 6–20)
CO2: 28 mmol/L (ref 22–32)
Calcium: 9.1 mg/dL (ref 8.9–10.3)
Chloride: 104 mmol/L (ref 98–111)
Creatinine, Ser: 0.92 mg/dL (ref 0.61–1.24)
GFR, Estimated: >60 mL/min (ref >60)
Glucose, Bld: 107 mg/dL — ABNORMAL HIGH (ref 70–99)
Potassium: 3.6 mmol/L (ref 3.5–5.1)
Sodium: 138 mmol/L (ref 135–145)

## 2022-02-23 LAB — CBC
HCT: 37.2 % — ABNORMAL LOW (ref 39.0–52.0)
Hemoglobin: 12.9 g/dL — ABNORMAL LOW (ref 13.0–17.0)
MCH: 30.8 pg (ref 26.0–34.0)
MCHC: 34.7 g/dL (ref 30.0–36.0)
MCV: 88.8 fL (ref 80.0–100.0)
Platelets: 152 10*3/uL (ref 150–400)
RBC: 4.19 MIL/uL — ABNORMAL LOW (ref 4.22–5.81)
RDW: 12.6 % (ref 11.5–15.5)
WBC: 6.2 10*3/uL (ref 4.0–10.5)
nRBC: 0 % (ref 0.0–0.2)

## 2022-02-23 MED ORDER — METHOCARBAMOL 500 MG PO TABS
ORAL_TABLET | ORAL | Status: AC
  Filled 2022-02-23: qty 1

## 2022-02-23 MED ORDER — ACETAMINOPHEN 500 MG PO TABS
ORAL_TABLET | ORAL | Status: AC
  Filled 2022-02-23: qty 1

## 2022-02-23 MED ORDER — ENOXAPARIN SODIUM 30 MG/0.3ML IJ SOSY
30.0000 mg | PREFILLED_SYRINGE | Freq: Two times a day (BID) | INTRAMUSCULAR | Status: DC
  Administered 2022-02-23 – 2022-03-07 (×22): 30 mg via SUBCUTANEOUS
  Filled 2022-02-23 (×23): qty 0.3

## 2022-02-23 MED ORDER — METHOCARBAMOL 1000 MG/10ML IJ SOLN
500.0000 mg | Freq: Four times a day (QID) | INTRAVENOUS | Status: DC
  Filled 2022-02-23: qty 5

## 2022-02-23 MED ORDER — POLYETHYLENE GLYCOL 3350 17 G PO PACK
PACK | ORAL | Status: AC
  Filled 2022-02-23: qty 1

## 2022-02-23 MED ORDER — METHOCARBAMOL 500 MG PO TABS
500.0000 mg | ORAL_TABLET | Freq: Four times a day (QID) | ORAL | Status: DC
  Administered 2022-02-23 – 2022-02-25 (×8): 500 mg via ORAL
  Filled 2022-02-23 (×8): qty 1

## 2022-02-23 MED ORDER — POLYETHYLENE GLYCOL 3350 17 G PO PACK
17.0000 g | PACK | Freq: Every day | ORAL | Status: DC
  Administered 2022-02-23 – 2022-02-25 (×2): 17 g via ORAL
  Filled 2022-02-23 (×2): qty 1

## 2022-02-23 MED ORDER — POVIDONE-IODINE 10 % EX SWAB
2.0000 "application " | Freq: Once | CUTANEOUS | Status: AC
  Administered 2022-02-24: 2 via TOPICAL

## 2022-02-23 MED ORDER — CEFAZOLIN SODIUM-DEXTROSE 2-4 GM/100ML-% IV SOLN
2.0000 g | INTRAVENOUS | Status: AC
  Administered 2022-02-24: 2 g via INTRAVENOUS
  Filled 2022-02-23: qty 100

## 2022-02-23 MED ORDER — BISACODYL 10 MG RE SUPP: 10.0000 mg | Freq: Every day | RECTAL | Status: DC | PRN

## 2022-02-23 MED ORDER — HYDROMORPHONE HCL 1 MG/ML IJ SOLN
1.0000 mg | INTRAMUSCULAR | Status: DC | PRN
  Administered 2022-02-23 – 2022-02-24 (×3): 1 mg via INTRAVENOUS
  Filled 2022-02-23 (×3): qty 1

## 2022-02-23 MED ORDER — CHLORHEXIDINE GLUCONATE 4 % EX LIQD
60.0000 mL | Freq: Once | CUTANEOUS | Status: AC
  Administered 2022-02-24: 4 via TOPICAL

## 2022-02-23 NOTE — Progress Notes (Signed)
Informed consent obtained and placed in chart.
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
## 2022-02-23 NOTE — Progress Notes (Signed)
Trauma Event Note    TRN rounding - Pt c/o decreased sensation in right index finger-- has gutter splint on, ace wrap not tight, good cap refill, will inform PA of condition.  Pt pain is controlled at present.  TRN to follow Last imported Vital Signs BP (!) 102/58 (BP Location: Left Arm)   Pulse 69   Temp 98.5 F (36.9 C) (Oral)   Resp 18   Ht 5\' 6"  (1.676 m)   Wt 150 lb (68 kg)   SpO2 97%   BMI 24.21 kg/m   Trending CBC Recent Labs    02/22/22 0930 02/22/22 0935  WBC 13.3*  --   HGB 14.3 14.6  HCT 40.8 43.0  PLT 189  --     Trending Coag's Recent Labs    02/22/22 0930  INR 1.0    Trending BMET Recent Labs    02/22/22 0930 02/22/22 0935  NA 138 140  K 4.0 4.0  CL 105 103  CO2 22  --   BUN 7 7  CREATININE 0.83 0.90  GLUCOSE 125* 123*      Joshua Orozco M Joshua Orozco  Trauma Response RN  Please call TRN at [phone] for further assistance.
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
## 2022-02-23 NOTE — Plan of Care (Signed)

## 2022-02-24 ENCOUNTER — Encounter (HOSPITAL_COMMUNITY): Admission: EM | Disposition: A | Discharge status: Rehabilitation Facility | Payer: Self-pay | Source: Home / Self Care

## 2022-02-24 ENCOUNTER — Inpatient Hospital Stay (HOSPITAL_COMMUNITY): Payer: Self-pay

## 2022-02-24 ENCOUNTER — Other Ambulatory Visit: Payer: Self-pay

## 2022-02-24 ENCOUNTER — Inpatient Hospital Stay (HOSPITAL_COMMUNITY): Payer: Self-pay | Admitting: Certified Registered Nurse Anesthetist

## 2022-02-24 ENCOUNTER — Encounter (HOSPITAL_COMMUNITY): Payer: Self-pay

## 2022-02-24 DIAGNOSIS — S62304A Unspecified fracture of fourth metacarpal bone, right hand, initial encounter for closed fracture: Secondary | ICD-10-CM

## 2022-02-24 HISTORY — PX: OPEN REDUCTION INTERNAL FIXATION (ORIF) METACARPAL: SHX6234

## 2022-02-24 LAB — CBC
HCT: 34.2 % — ABNORMAL LOW (ref 39.0–52.0)
Hemoglobin: 11.9 g/dL — ABNORMAL LOW (ref 13.0–17.0)
MCH: 30.9 pg (ref 26.0–34.0)
MCHC: 34.8 g/dL (ref 30.0–36.0)
MCV: 88.8 fL (ref 80.0–100.0)
Platelets: 147 10*3/uL — ABNORMAL LOW (ref 150–400)
RBC: 3.85 MIL/uL — ABNORMAL LOW (ref 4.22–5.81)
RDW: 12.3 % (ref 11.5–15.5)
WBC: 6.4 10*3/uL (ref 4.0–10.5)
nRBC: 0 % (ref 0.0–0.2)

## 2022-02-24 SURGERY — OPEN REDUCTION INTERNAL FIXATION (ORIF) METACARPAL
Anesthesia: Monitor Anesthesia Care | Site: Finger | Laterality: Right

## 2022-02-24 MED ORDER — ONDANSETRON HCL 4 MG/2ML IJ SOLN
INTRAMUSCULAR | Status: AC
  Filled 2022-02-24: qty 2

## 2022-02-24 MED ORDER — CHLORHEXIDINE GLUCONATE 0.12 % MT SOLN
OROMUCOSAL | Status: AC
  Administered 2022-02-24: 15 mL via OROMUCOSAL
  Filled 2022-02-24: qty 15

## 2022-02-24 MED ORDER — ACETAMINOPHEN 10 MG/ML IV SOLN
1000.0000 mg | Freq: Once | INTRAVENOUS | Status: DC | PRN
  Administered 2022-02-24: 1000 mg via INTRAVENOUS

## 2022-02-24 MED ORDER — LIDOCAINE 2% (20 MG/ML) 5 ML SYRINGE
INTRAMUSCULAR | Status: DC | PRN
  Administered 2022-02-24: 40 mg via INTRAVENOUS

## 2022-02-24 MED ORDER — OXYCODONE HCL 5 MG PO TABS
5.0000 mg | ORAL_TABLET | Freq: Once | ORAL | Status: AC | PRN
  Administered 2022-02-24: 5 mg via ORAL

## 2022-02-24 MED ORDER — OXYCODONE HCL 5 MG PO TABS
ORAL_TABLET | ORAL | Status: AC
  Filled 2022-02-24: qty 1

## 2022-02-24 MED ORDER — LACTATED RINGERS IV SOLN: INTRAVENOUS | Status: DC

## 2022-02-24 MED ORDER — PHENYLEPHRINE 80 MCG/ML (10ML) SYRINGE FOR IV PUSH (FOR BLOOD PRESSURE SUPPORT)
PREFILLED_SYRINGE | INTRAVENOUS | Status: AC
  Filled 2022-02-24: qty 10

## 2022-02-24 MED ORDER — ACETAMINOPHEN 160 MG/5ML PO SOLN: 325.0000 mg | ORAL | Status: DC | PRN

## 2022-02-24 MED ORDER — MIDAZOLAM HCL 2 MG/2ML IJ SOLN
INTRAMUSCULAR | Status: AC
Start: 2022-02-24 — End: ?
  Filled 2022-02-24: qty 2

## 2022-02-24 MED ORDER — ACETAMINOPHEN 10 MG/ML IV SOLN
INTRAVENOUS | Status: AC
  Filled 2022-02-24: qty 100

## 2022-02-24 MED ORDER — ONDANSETRON HCL 4 MG/2ML IJ SOLN
INTRAMUSCULAR | Status: DC | PRN
  Administered 2022-02-24: 4 mg via INTRAVENOUS

## 2022-02-24 MED ORDER — PROPOFOL 10 MG/ML IV BOLUS
INTRAVENOUS | Status: DC | PRN
  Administered 2022-02-24: 20 mg via INTRAVENOUS
  Administered 2022-02-24: 180 mg via INTRAVENOUS

## 2022-02-24 MED ORDER — LIDOCAINE 2% (20 MG/ML) 5 ML SYRINGE
INTRAMUSCULAR | Status: AC
  Filled 2022-02-24: qty 5

## 2022-02-24 MED ORDER — 0.9 % SODIUM CHLORIDE (POUR BTL) OPTIME
TOPICAL | Status: DC | PRN
  Administered 2022-02-24: 1000 mL

## 2022-02-24 MED ORDER — BUPIVACAINE HCL (PF) 0.25 % IJ SOLN
INTRAMUSCULAR | Status: AC
  Filled 2022-02-24: qty 30

## 2022-02-24 MED ORDER — CHLORHEXIDINE GLUCONATE 0.12 % MT SOLN: 15.0000 mL | Freq: Once | OROMUCOSAL | Status: AC

## 2022-02-24 MED ORDER — AMISULPRIDE (ANTIEMETIC) 5 MG/2ML IV SOLN: 10.0000 mg | Freq: Once | INTRAVENOUS | Status: DC | PRN

## 2022-02-24 MED ORDER — BUPIVACAINE HCL 0.25 % IJ SOLN
INTRAMUSCULAR | Status: DC | PRN
  Administered 2022-02-24: 10 mL

## 2022-02-24 MED ORDER — ACETAMINOPHEN 325 MG PO TABS: 325.0000 mg | ORAL_TABLET | ORAL | Status: DC | PRN

## 2022-02-24 MED ORDER — FENTANYL CITRATE (PF) 250 MCG/5ML IJ SOLN
INTRAMUSCULAR | Status: DC | PRN
  Administered 2022-02-24 (×7): 25 ug via INTRAVENOUS

## 2022-02-24 MED ORDER — OXYCODONE HCL 5 MG/5ML PO SOLN: 5.0000 mg | Freq: Once | ORAL | Status: AC | PRN

## 2022-02-24 MED ORDER — FENTANYL CITRATE (PF) 100 MCG/2ML IJ SOLN: 25.0000 ug | INTRAMUSCULAR | Status: DC | PRN

## 2022-02-24 MED ORDER — PHENYLEPHRINE 80 MCG/ML (10ML) SYRINGE FOR IV PUSH (FOR BLOOD PRESSURE SUPPORT)
PREFILLED_SYRINGE | INTRAVENOUS | Status: DC | PRN
  Administered 2022-02-24 (×2): 80 ug via INTRAVENOUS

## 2022-02-24 MED ORDER — MIDAZOLAM HCL 2 MG/2ML IJ SOLN
INTRAMUSCULAR | Status: DC | PRN
  Administered 2022-02-24: 1 mg via INTRAVENOUS

## 2022-02-24 MED ORDER — ORAL CARE MOUTH RINSE
15.0000 mL | Freq: Once | OROMUCOSAL | Status: AC
Start: 2022-02-24 — End: 2022-02-24

## 2022-02-24 MED ORDER — FENTANYL CITRATE (PF) 250 MCG/5ML IJ SOLN
INTRAMUSCULAR | Status: AC
  Filled 2022-02-24: qty 5

## 2022-02-24 SURGICAL SUPPLY — 49 items
APL PRP STRL LF DISP 70% ISPRP (MISCELLANEOUS) ×1
BAG COUNTER SPONGE SURGICOUNT (BAG) ×2 IMPLANT
BAG SPNG CNTER NS LX DISP (BAG) ×1
BNDG CMPR 9X4 STRL LF SNTH (GAUZE/BANDAGES/DRESSINGS)
BNDG ELASTIC 3X5.8 VLCR STR LF (GAUZE/BANDAGES/DRESSINGS) ×2 IMPLANT
BNDG ELASTIC 4X5.8 VLCR STR LF (GAUZE/BANDAGES/DRESSINGS) ×1 IMPLANT
BNDG ESMARK 4X9 LF (GAUZE/BANDAGES/DRESSINGS) IMPLANT
BNDG GAUZE ELAST 4 BULKY (GAUZE/BANDAGES/DRESSINGS) ×1 IMPLANT
CANISTER SUCT 3000ML PPV (MISCELLANEOUS) ×2 IMPLANT
CHLORAPREP W/TINT 26 (MISCELLANEOUS) ×2 IMPLANT
CORD BIPOLAR FORCEPS 12FT (ELECTRODE) ×2 IMPLANT
COVER SURGICAL LIGHT HANDLE (MISCELLANEOUS) ×2 IMPLANT
CUFF TOURN SGL QUICK 18X4 (TOURNIQUET CUFF) ×2 IMPLANT
CUFF TOURN SGL QUICK 24 (TOURNIQUET CUFF)
CUFF TRNQT CYL 24X4X16.5-23 (TOURNIQUET CUFF) IMPLANT
DRAIN TLS ROUND 10FR (DRAIN) IMPLANT
DRAPE OEC MINIVIEW 54X84 (DRAPES) ×2 IMPLANT
DRAPE SURG 17X23 STRL (DRAPES) ×2 IMPLANT
DRSG XEROFORM 1X8 (GAUZE/BANDAGES/DRESSINGS) ×1 IMPLANT
GAUZE SPONGE 4X4 12PLY STRL (GAUZE/BANDAGES/DRESSINGS) ×1 IMPLANT
GAUZE XEROFORM 1X8 LF (GAUZE/BANDAGES/DRESSINGS) ×2 IMPLANT
GLOVE BIOGEL M 8.0 STRL (GLOVE) ×2 IMPLANT
GOWN STRL REUS W/ TWL XL LVL3 (GOWN DISPOSABLE) ×2 IMPLANT
GOWN STRL REUS W/TWL XL LVL3 (GOWN DISPOSABLE) ×4
KIT BASIN OR (CUSTOM PROCEDURE TRAY) ×2 IMPLANT
KIT INNATE INSTRUMENT FOR 3.6 (INSTRUMENTS) ×1 IMPLANT
NAIL IM THRD INNATE 3.6X35 (Nail) ×1 IMPLANT
NEEDLE HYPO 22GX1.5 SAFETY (NEEDLE) IMPLANT
NS IRRIG 1000ML POUR BTL (IV SOLUTION) ×2 IMPLANT
PACK ORTHO EXTREMITY (CUSTOM PROCEDURE TRAY) ×2 IMPLANT
PAD CAST 3X4 CTTN HI CHSV (CAST SUPPLIES) IMPLANT
PAD CAST 4YDX4 CTTN HI CHSV (CAST SUPPLIES) IMPLANT
PADDING CAST ABS 4INX4YD NS (CAST SUPPLIES) ×1
PADDING CAST ABS COTTON 4X4 ST (CAST SUPPLIES) ×1 IMPLANT
PADDING CAST COTTON 3X4 STRL (CAST SUPPLIES)
PADDING CAST COTTON 4X4 STRL (CAST SUPPLIES)
SPLINT PLASTER CAST XFAST 4X15 (CAST SUPPLIES) IMPLANT
SPLINT PLASTER XTRA FAST SET 4 (CAST SUPPLIES)
SUT ETHILON 3 0 PS 1 (SUTURE) ×2 IMPLANT
SUT ETHILON 4 0 PS 2 18 (SUTURE) ×2 IMPLANT
SUT VIC AB 3-0 PS1 18 (SUTURE) ×2
SUT VIC AB 3-0 PS1 18XBRD (SUTURE) ×1 IMPLANT
SUT VICRYL 4-0 PS2 18IN ABS (SUTURE) ×2 IMPLANT
SYR BULB EAR ULCER 3OZ GRN STR (SYRINGE) ×2 IMPLANT
SYR CONTROL 10ML LL (SYRINGE) ×1 IMPLANT
TOWEL GREEN STERILE FF (TOWEL DISPOSABLE) ×2 IMPLANT
TUBE CONNECTING 20X1/4 (TUBING) ×2 IMPLANT
UNDERPAD 30X36 HEAVY ABSORB (UNDERPADS AND DIAPERS) ×2 IMPLANT
WATER STERILE IRR 1000ML POUR (IV SOLUTION) ×2 IMPLANT

## 2022-02-24 NOTE — Discharge Instructions (Signed)
Elevate R hand, no lifting with R hand; move fingers, keep dressing clean and dry.

## 2022-02-24 NOTE — Transfer of Care (Signed)
Immediate Anesthesia Transfer of Care Note  Patient: Joshua Orozco  Procedure(s) Performed: OPEN REDUCTION INTERNAL FIXATION (ORIF) RIGHT 4TH METACARPAL (Right: Finger)  Patient Location: PACU  Anesthesia Type:General  Level of Consciousness: awake, drowsy, patient cooperative and responds to stimulation  Airway & Oxygen Therapy: Patient Spontanous Breathing and Patient connected to nasal cannula oxygen  Post-op Assessment: Report given to RN and Post -op Vital signs reviewed and stable  Post vital signs: Reviewed and stable  Last Vitals:  Vitals Value Taken Time  BP 156/64 02/24/22 1556  Temp    Pulse 110 02/24/22 1557  Resp 19 02/24/22 1557  SpO2 98 % 02/24/22 1557  Vitals shown include unvalidated device data.  Last Pain:  Vitals:   02/24/22 1325  TempSrc: Oral  PainSc: 9       Patients Stated Pain Goal: 3 (02/24/22 1325)  Complications: No notable events documented.
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
## 2022-02-24 NOTE — Progress Notes (Signed)
Patient ID: Joshua Orozco, male   DOB: 10-Jan-1986, 36 y.o.   MRN: [id number]   I was able to have Dr Jena Gauss review pelvic injury studies  No change in plan  WBAT BLE Non operative management  Follow up in 4-6 weeks for X-rays
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
## 2022-02-24 NOTE — Anesthesia Procedure Notes (Signed)
Procedure Name: LMA Insertion Date/Time: 02/24/2022 2:32 PM Performed by: Ayesha Rumpf, CRNA Pre-anesthesia Checklist: Patient identified, Emergency Drugs available, Suction available and Patient being monitored Patient Re-evaluated:Patient Re-evaluated prior to induction Oxygen Delivery Method: Circle System Utilized Preoxygenation: Pre-oxygenation with 100% oxygen Induction Type: IV induction Ventilation: Mask ventilation without difficulty LMA: LMA inserted LMA Size: 4.0 Number of attempts: 1 Airway Equipment and Method: Bite block Placement Confirmation: positive ETCO2 Tube secured with: Tape Dental Injury: Teeth and Oropharynx as per pre-operative assessment

## 2022-02-24 NOTE — Progress Notes (Signed)
I have seen and examined this patient and agree with PA Jeffery's consult.  Will plan on ORIF R 4th metacarpal fracture today.  Explained to patient, all questions answered.

## 2022-02-24 NOTE — Anesthesia Preprocedure Evaluation (Addendum)
Anesthesia Evaluation  Patient identified by MRN, date of birth, ID band Patient awake    Reviewed: Allergy & Precautions, NPO status , Patient's Chart, lab work & pertinent test results  Airway Mallampati: I  TM Distance: >3 FB Neck ROM: Full    Dental  (+) Teeth Intact, Dental Advisory Given   Pulmonary neg pulmonary ROS,    breath sounds clear to auscultation       Cardiovascular negative cardio ROS   Rhythm:Regular Rate:Normal     Neuro/Psych    GI/Hepatic negative GI ROS, Neg liver ROS,   Endo/Other  negative endocrine ROS  Renal/GU negative Renal ROS     Musculoskeletal negative musculoskeletal ROS (+)   Abdominal Normal abdominal exam  (+)   Peds  Hematology negative hematology ROS (+)   Anesthesia Other Findings   Reproductive/Obstetrics                            Anesthesia Physical Anesthesia Plan  ASA: 1  Anesthesia Plan: MAC   Post-op Pain Management:    Induction: Intravenous  PONV Risk Score and Plan: 2 and Ondansetron, Dexamethasone, Midazolam and Propofol infusion  Airway Management Planned: Natural Airway and Simple Face Mask  Additional Equipment: None  Intra-op Plan:   Post-operative Plan:   Informed Consent: I have reviewed the patients History and Physical, chart, labs and discussed the procedure including the risks, benefits and alternatives for the proposed anesthesia with the patient or authorized representative who has indicated his/her understanding and acceptance.     Dental advisory given and Interpreter used for interveiw  Plan Discussed with: CRNA  Anesthesia Plan Comments:        Anesthesia Quick Evaluation

## 2022-02-25 ENCOUNTER — Inpatient Hospital Stay (HOSPITAL_COMMUNITY): Payer: Self-pay

## 2022-02-25 ENCOUNTER — Encounter (HOSPITAL_COMMUNITY): Payer: Self-pay | Admitting: General Surgery

## 2022-02-25 LAB — CBC
HCT: 33.7 % — ABNORMAL LOW (ref 39.0–52.0)
Hemoglobin: 11.3 g/dL — ABNORMAL LOW (ref 13.0–17.0)
MCH: 30.1 pg (ref 26.0–34.0)
MCHC: 33.5 g/dL (ref 30.0–36.0)
MCV: 89.6 fL (ref 80.0–100.0)
Platelets: 152 10*3/uL (ref 150–400)
RBC: 3.76 MIL/uL — ABNORMAL LOW (ref 4.22–5.81)
RDW: 12.3 % (ref 11.5–15.5)
WBC: 6.9 10*3/uL (ref 4.0–10.5)
nRBC: 0 % (ref 0.0–0.2)

## 2022-02-25 IMAGING — DX DG PELVIS 3+V JUDET
3 series · 3 of 3 positions shown · non-contrast
Comparison: None Available.

CLINICAL DATA: Pelvic fractures.

EXAM:
JUDET PELVIS - 3+ VIEW

[t pelvis ap (1 of 3)]
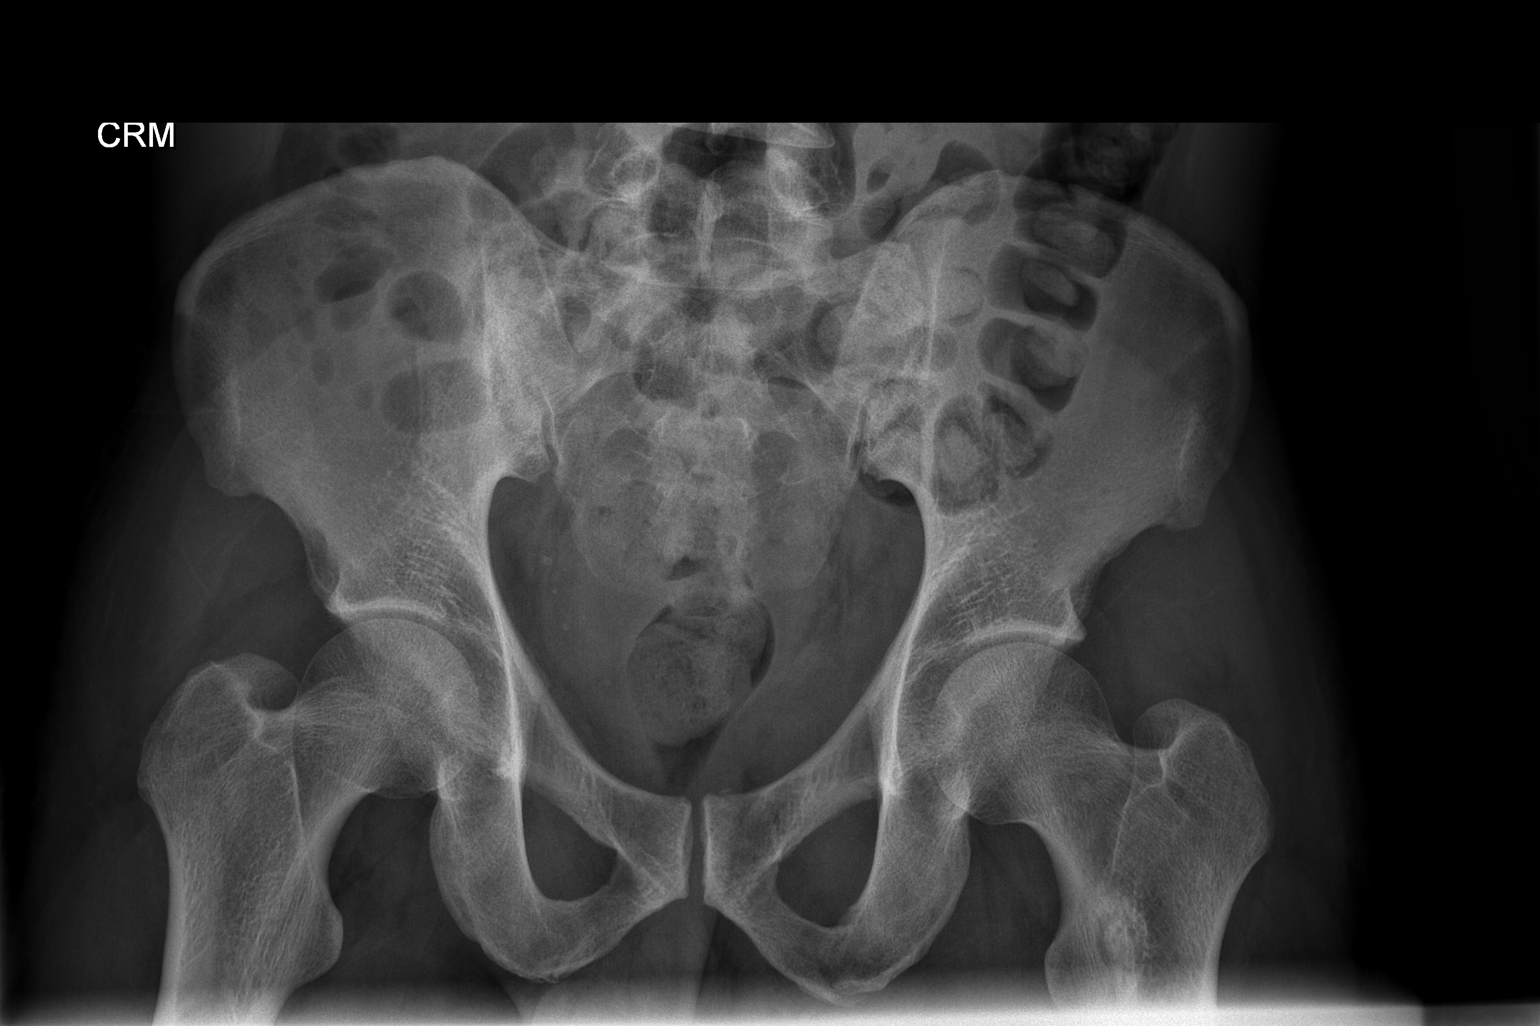

[t pelvis ap (2 of 3)]
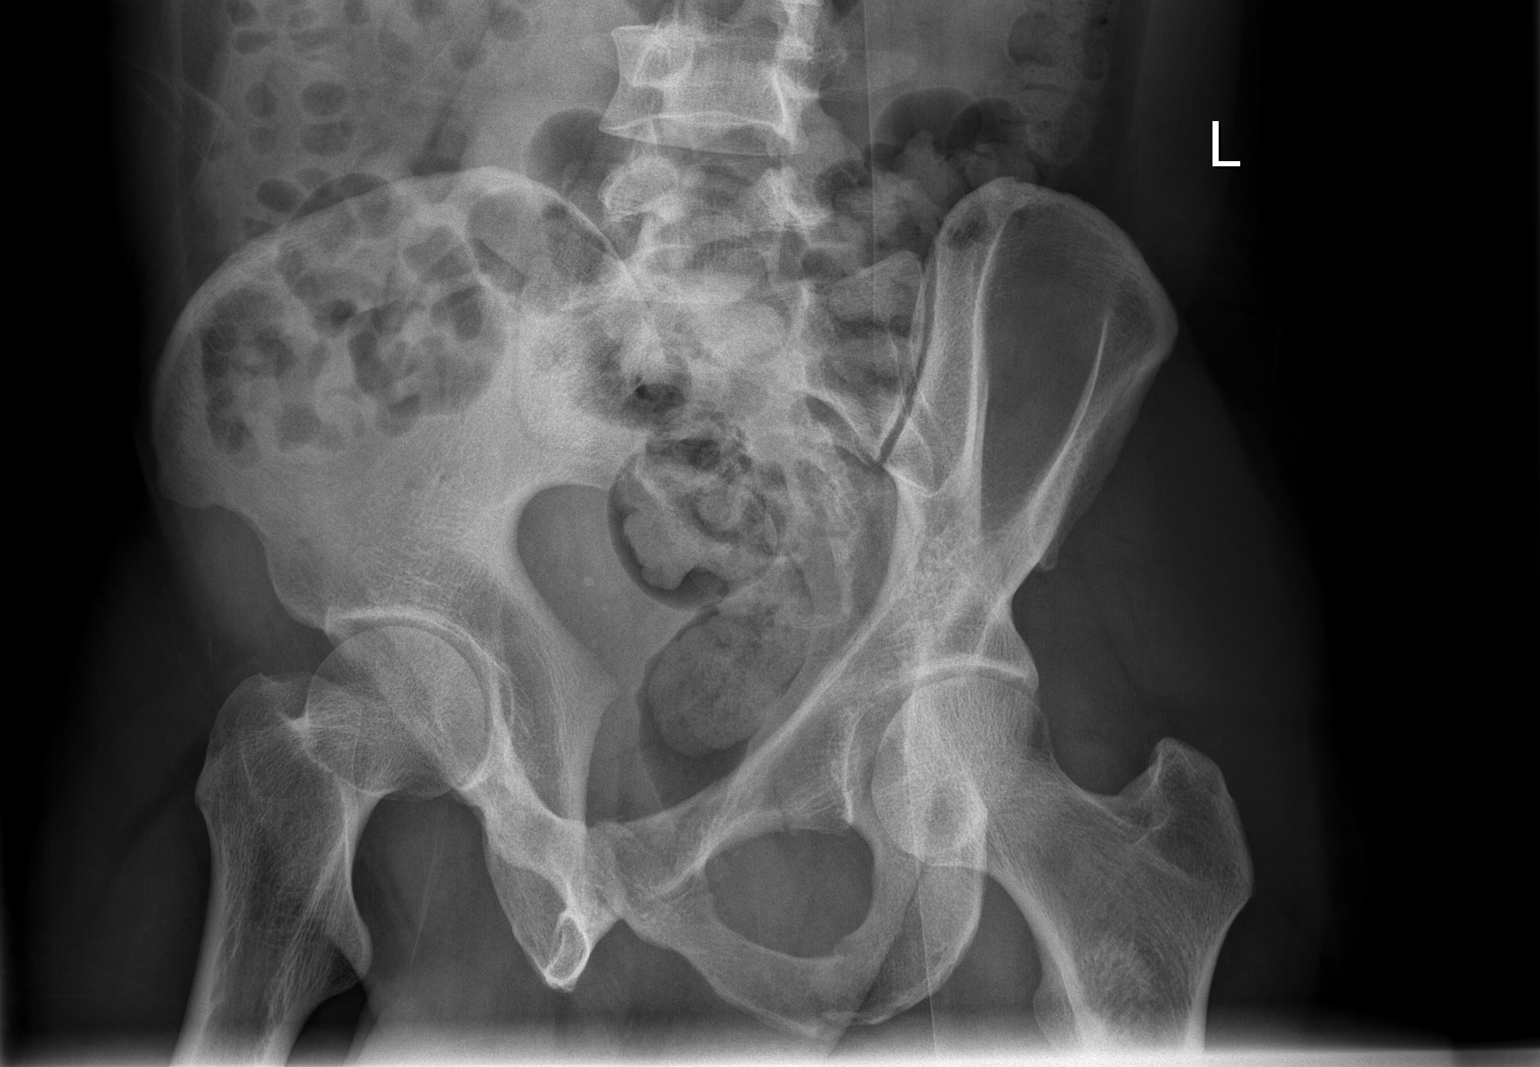

[t pelvis ap (3 of 3)]
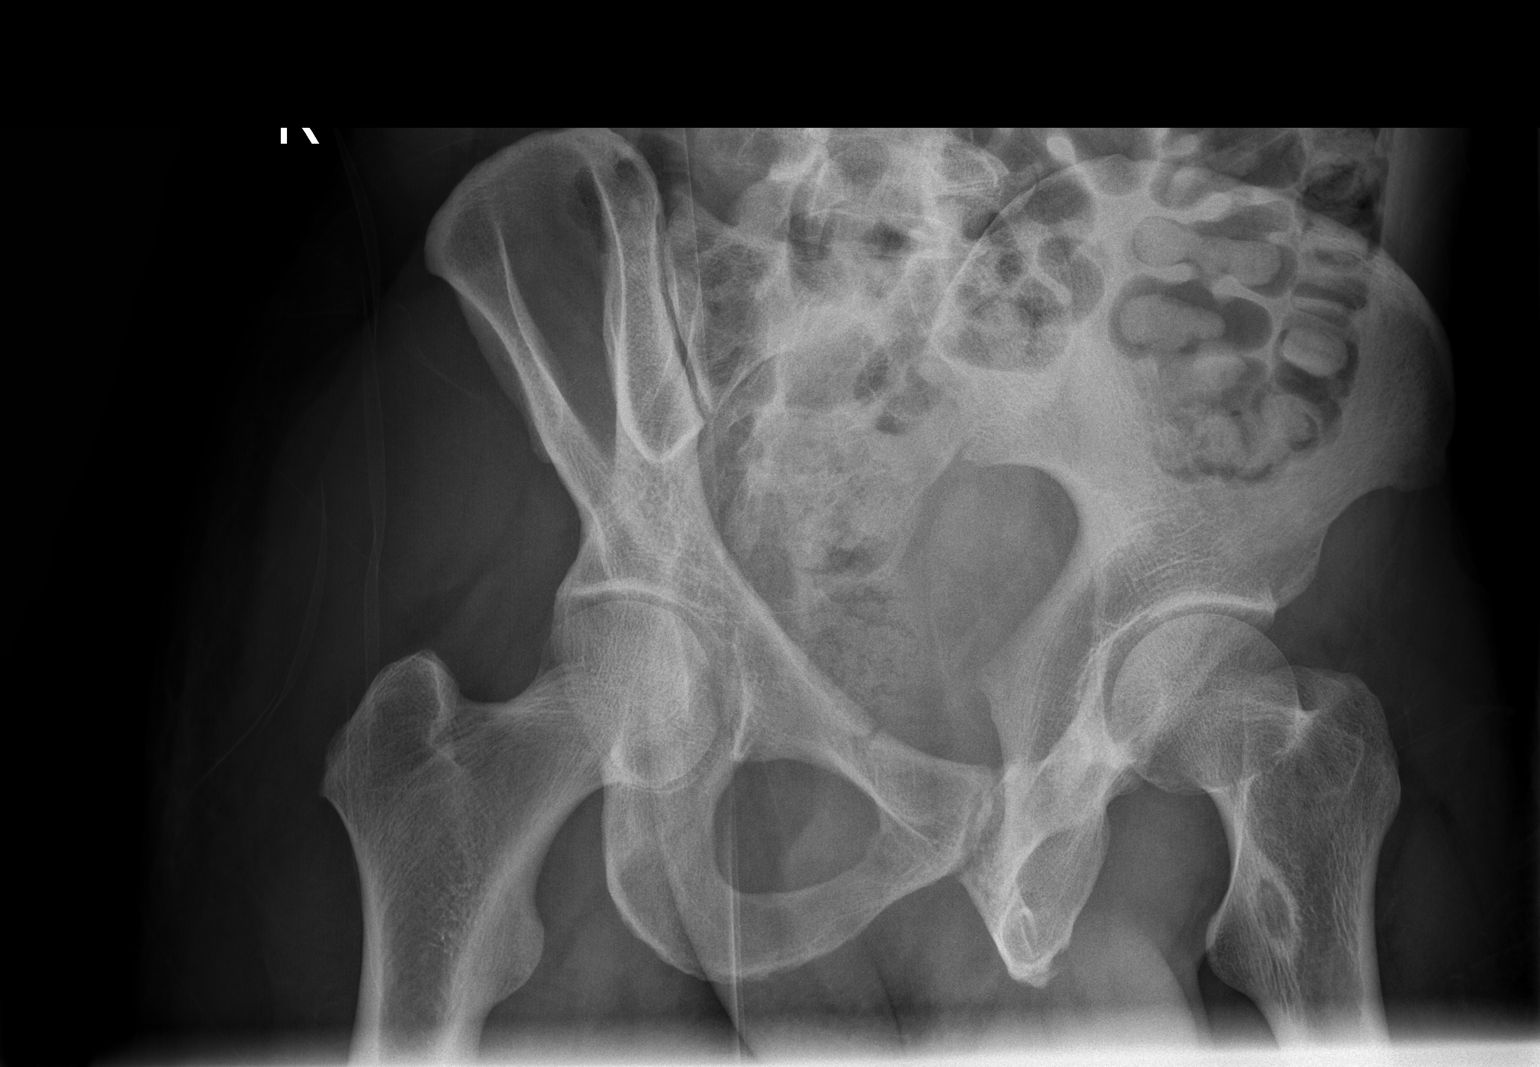

[3 of 3 positions shown; findings below may reference images not displayed]

FINDINGS: Minimally displaced fractures through the LEFT superior inferior
pubic ramus. No pubic symphysis diastasis. Sacral fracture is not
appreciated. SI joints appear normal.
IMPRESSION: Minimally displaced LEFT pubic bone fractures unchanged from
comparison CT.

Sacral fracture not identified on radiograph.

## 2022-02-25 MED ORDER — HYDROMORPHONE HCL 1 MG/ML IJ SOLN
0.5000 mg | INTRAMUSCULAR | Status: DC | PRN
  Administered 2022-02-25 – 2022-03-06 (×16): 1 mg via INTRAVENOUS
  Filled 2022-02-25 (×18): qty 1

## 2022-02-25 MED ORDER — OXYCODONE HCL 5 MG PO TABS
10.0000 mg | ORAL_TABLET | ORAL | Status: DC | PRN
  Administered 2022-02-25 – 2022-02-26 (×2): 15 mg via ORAL
  Administered 2022-02-27: 10 mg via ORAL
  Administered 2022-02-28: 15 mg via ORAL
  Administered 2022-02-28 – 2022-03-01 (×3): 10 mg via ORAL
  Administered 2022-03-01 – 2022-03-03 (×7): 15 mg via ORAL
  Administered 2022-03-03: 10 mg via ORAL
  Administered 2022-03-04 – 2022-03-05 (×3): 15 mg via ORAL
  Administered 2022-03-05: 10 mg via ORAL
  Administered 2022-03-06 – 2022-03-07 (×3): 15 mg via ORAL
  Filled 2022-02-25: qty 2
  Filled 2022-02-25 (×2): qty 3
  Filled 2022-02-25: qty 2
  Filled 2022-02-25 (×5): qty 3
  Filled 2022-02-25 (×2): qty 2
  Filled 2022-02-25 (×5): qty 3
  Filled 2022-02-25: qty 2
  Filled 2022-02-25 (×3): qty 3
  Filled 2022-02-25: qty 2
  Filled 2022-02-25 (×4): qty 3

## 2022-02-25 MED ORDER — METHOCARBAMOL 500 MG PO TABS
1000.0000 mg | ORAL_TABLET | Freq: Three times a day (TID) | ORAL | Status: DC
  Administered 2022-02-25 – 2022-03-02 (×17): 1000 mg via ORAL
  Filled 2022-02-25 (×17): qty 2
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
## 2022-02-25 NOTE — TOC CAGE-AID Note (Signed)
Transition of Care [facility]) - CAGE-AID Screening   Patient Details  Name: Joshua Orozco MRN: [id number] Date of Birth: Aug 12, 1986  Transition of Care [facility]) CM/SW Contact:    Bradlee Heitman C Tarpley-Carter, LCSWA Phone Number: 02/25/2022, 1:08 PM   Clinical Narrative: Pt participated in Cage-Aid.  Pt stated he does use ETOH.  Pt was offered resources, due to usage of ETOH.     Casidy Alberta Tarpley-Carter, MSW, LCSW-A Pronouns:  She/Her/Hers [facility]itions of [facility] Clinical Social Worker Direct Number:  [phone] [email or web address]  CAGE-AID Screening:    Have You Ever Felt You Ought to Cut Down on Your Drinking or Drug Use?: Yes Have People Annoyed You By Critizing Your Drinking Or Drug Use?: No Have You Felt Bad Or Guilty About Your Drinking Or Drug Use?: No Have You Ever Had a Drink or Used Drugs First Thing In The Morning to Steady Your Nerves or to Get Rid of a Hangover?: No CAGE-AID Score: 1  Substance Abuse Education Offered: Yes  Substance abuse interventions: Transport planner
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
## 2022-02-25 NOTE — Anesthesia Postprocedure Evaluation (Signed)
Anesthesia Post Note  Patient: Joshua Orozco  Procedure(s) Performed: OPEN REDUCTION INTERNAL FIXATION (ORIF) RIGHT 4TH METACARPAL (Right: Finger)     Patient location during evaluation: PACU Anesthesia Type: General Level of consciousness: awake and alert Pain management: pain level controlled Vital Signs Assessment: post-procedure vital signs reviewed and stable Respiratory status: spontaneous breathing, nonlabored ventilation, respiratory function stable and patient connected to nasal cannula oxygen Cardiovascular status: blood pressure returned to baseline and stable Postop Assessment: no apparent nausea or vomiting Anesthetic complications: no   No notable events documented.            Effie Berkshire

## 2022-02-25 NOTE — Progress Notes (Signed)
° °  Inpatient Rehab Admissions Coordinator : ° °Per therapy change in recommendations, patient was screened for CIR candidacy by Cranford Blessinger RN MSN.  At this time patient appears to be a potential candidate for CIR. I will place a rehab consult per protocol for full assessment. Please call me with any questions. ° °Jakiera Ehler RN MSN °Admissions Coordinator °[phone] °  °

## 2022-02-26 MED ORDER — POLYETHYLENE GLYCOL 3350 17 G PO PACK
17.0000 g | PACK | Freq: Two times a day (BID) | ORAL | Status: DC
  Administered 2022-02-26 – 2022-02-28 (×3): 17 g via ORAL
  Filled 2022-02-26 (×10): qty 1

## 2022-02-26 NOTE — Progress Notes (Signed)
IP rehab admissions - patient asleep when I tried to see him earlier today.  Unable to awaken him.  I will have my partner follow up again tomorrow.  Per trauma case manager, patient has no insurance, was working.  He stays with his landlord.  His boss can provide intermittent supervision.  Would probably need to be mod I to discharge home.  Call for questions.  [phone]
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
## 2022-02-26 NOTE — Plan of Care (Signed)
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
## 2022-02-26 NOTE — TOC Progression Note (Signed)
Transition of Care [facility]) - Progression Note    Patient Details  Name: Cormick Moss MRN: [id number] Date of Birth: 04/15/1986  [facility] CM/SW Contact  Ella Bodo, RN Phone Number: 02/26/2022, 3:00pm  Clinical Narrative:    Met with patient at end of therapy session, with Spanish interpreter present.  Patient states that his landlord states that he can stay at his current address until he can return back to work, and she can provide intermittent assistance.  Patient also states that his boss, Christy Sartorius, has offered to provide intermittent assistance as well.  Victor's phone number is [phone].  Patient agreeable to rehab if offered; noted CIR consult in progress. I have called Christy Sartorius, patient's boss, and he agrees to assist patient as needed at discharge.  He plans to visit patient and discuss discharge plans with him.   Expected Discharge Plan: IP Rehab Facility Barriers to Discharge: Continued Medical Work up  Expected Discharge Plan and Services Expected Discharge Plan: [place]   Discharge Planning Services: CM Consult   Living arrangements for the past 2 months: [place]                                       Social Determinants of Health (SDOH) Interventions    Readmission Risk Interventions     No data to display         Reinaldo Raddle, RN, BSN  Trauma/Neuro ICU Case Manager [phone]
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
## 2022-02-26 NOTE — Progress Notes (Signed)
   02/26/22 1600  Mobility  Activity Refused mobility (Pt asleep. Will f/u as time permits)

## 2022-02-27 MED ORDER — KETOROLAC TROMETHAMINE 15 MG/ML IJ SOLN
15.0000 mg | Freq: Four times a day (QID) | INTRAMUSCULAR | Status: DC
  Administered 2022-02-27 – 2022-03-03 (×16): 15 mg via INTRAVENOUS
  Filled 2022-02-27 (×16): qty 1
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
## 2022-02-27 NOTE — Progress Notes (Signed)
Inpatient Rehab Admissions Coordinator:    I met with Pt. To discuss potential CIR admit. Pt. Is interested, but is not sure about support at d/c. I will reach out to pt.'s boss to discuss.   Clemens Catholic, [place], [place] Admissions Coordinator  [phone] ([place]) [phone] (office)
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
## 2022-02-27 NOTE — Progress Notes (Signed)
Ortho Trauma Note  Patient seen and examined this AM.  Sounds like he did a lot better with therapy yesterday however he still had a lot of pain that made him almost passed out.  I have reviewed his post mobilization films as well as a CT scan and injury AP pelvis.  The patient has superior and inferior pubic rami fractures on the left.  There is a small nondisplaced mid sacral fracture however there is no significant instability on radiographic imaging for his SI joint or sacral body.  I discussed with him that we potentially could take him to surgery to stress his pelvis as well as provide him percutaneous fixation.  However with his radiographs and the low clinical suspicion that he would be unstable with stress views I feel that this is likely not needed.  I feel that he is having fracture related pain from his injury and this would likely resolve over the next 1 to 2 weeks.  I have no restrictions in terms of weightbearing.  I will recommend pain management.  He will likely need inpatient rehab as he lives alone.  The patient can follow-up with me on an outpatient basis in 1 to 2 weeks get repeat radiographs.  Depending on how long he is in rehab we may be able to repeat these imaging next week.  However I do not feel there is any role for pelvic fixation at this time.  My evaluation and management was performed with the use of an interpreter.  Roby Lofts, MD [facility] [phone] (office) [email or web address]
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
## 2022-02-28 NOTE — Progress Notes (Signed)
  Mobility Specialist Criteria Algorithm Info.    02/28/22 1215  Pain Assessment  Pain Assessment 0-10  Pain Score 9  Pain Location RLE/LLE  Pain Descriptors / Indicators Constant;Discomfort;Grimacing;Numbness;Pins and needles  Pain Intervention(s) Limited activity within patient's tolerance;RN gave pain meds during session  Mobility  Activity Ambulated with assistance in room;Transferred from bed to chair (to chair after ambulation)  Range of Motion/Exercises Active;All extremities  Level of Assistance Moderate assist, patient does 50-74% (Safety (chair follow))  Assistive Device Front wheel walker (R platform)  RUE Weight Bearing Weight bear through elbow only  RLE Weight Bearing WBAT  LLE Weight Bearing WBAT  Distance Ambulated (ft) 20 ft  Activity Response Tolerated well; RN present throughout   Patient received in supine eager to participate in mobility despite 9/10 pain. Pt is very motivated and stated multiple times that "in order to get better have to get up and push through the pain". RN gave pain medication during session in which he stated his LE's felt numb and tingling. Required min A to EOB from supine > sit, more so assisted trunk from side lying to sitting. Mod A sit > stand + cues for hand placement. Ambulated short distance in room with chair follow d/t leg numbness. Patient grimaced in pain with each step eventually becoming tearful. LE's buckled x1 required seated rest x1 second to fatigue. Upon completing ambulation pt remained in recliner chair. Tolerated well without incident. Was left in recliner with all needs met, call bell in reach.   02/28/2022 2:44 PM  Joshua Orozco, [place], [place]  RDEYC:[phone] Office: [phone]
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
## 2022-03-02 NOTE — Progress Notes (Signed)
Mobility Specialist Criteria Algorithm Info.   03/02/22 1640  Pain Assessment  Pain Assessment 0-10  Pain Score 10  Faces Pain Scale 10  Pain Location RLE/LLE  Pain Descriptors / Indicators Grimacing;Discomfort;Constant;Aching;Throbbing  Pain Intervention(s) Limited activity within patient's tolerance;Utilized relaxation techniques  Mobility  Activity Ambulated with assistance in hallway  Range of Motion/Exercises Active;All extremities  Level of Assistance Minimal assist, patient does 75% or more  Assistive Device Front wheel walker (platform)  RUE Weight Bearing NWB  RLE Weight Bearing WBAT  LLE Weight Bearing WBAT  Distance Ambulated (ft) 20 ft  Activity Response Tolerated well (mod cues for sequencing and breathing for pain management.)   Patient received in recliner chair eager to participate in mobility. Requested to ambulate first then return to bed. Required min A sit>stand and min guard to ambulate short distance in room with slow gait. Pain still limiting distance and participation but still very much so motivated and determined to complete each task. Returned to bed without complication but required mod A to assist LE's while transitioning from sit>supine. Was left in supine with all needs met, call bell in reach.   03/02/2022 5:12 PM  Joshua Orozco, [place], [place]  BULAG:[phone] Office: [phone]
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
## 2022-03-02 NOTE — Progress Notes (Signed)
Inpatient Rehab Admissions Coordinator:   I do not have a CIR bed for this Pt. I will continue to follow for potential admit pending  bed availability.   Jlyn Cerros, MS, CCC-SLP Rehab Admissions Coordinator  [phone] (celll) [phone] (office) 

## 2022-03-03 MED ORDER — METHOCARBAMOL 500 MG PO TABS
1000.0000 mg | ORAL_TABLET | Freq: Three times a day (TID) | ORAL | Status: DC | PRN
  Administered 2022-03-03 – 2022-03-04 (×2): 1000 mg via ORAL
  Filled 2022-03-03 (×2): qty 2

## 2022-03-03 MED ORDER — ACETAMINOPHEN 500 MG PO TABS: 1000.0000 mg | ORAL_TABLET | Freq: Four times a day (QID) | ORAL | Status: DC | PRN
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
## 2022-03-03 NOTE — Progress Notes (Signed)
Mobility Specialist Progress Note:   03/03/22 1600  Mobility  Activity Transferred from chair to bed  Level of Assistance Minimal assist, patient does 75% or more  Assistive Device Front wheel walker (platform)  RUE Weight Bearing Weight bear through elbow only  RLE Weight Bearing WBAT  LLE Weight Bearing WBAT  Distance Ambulated (ft) 5 ft  Activity Response Tolerated well  $Mobility charge 1 Mobility   Pt requesting to transfer to bed from chair. Required minA to stand from recliner and to take steps to bed. Pt back in bed with all needs met, bed alarm on.   Nelta Numbers Acute Rehab Secure Chat or Office Phone: [phone]
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
## 2022-03-03 NOTE — Plan of Care (Signed)
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
## 2022-03-03 NOTE — Progress Notes (Signed)
Inpatient Rehab Admissions Coordinator:   I do not have ia CIR bed for this Pt. Today. I will continue to follow for potential admit pending  and bed availability.   Clemens Catholic, [place], [place] Admissions Coordinator  [phone] ([place]) [phone] (office)

## 2022-03-04 MED ORDER — ACETAMINOPHEN 500 MG PO TABS
1000.0000 mg | ORAL_TABLET | Freq: Four times a day (QID) | ORAL | Status: DC
  Administered 2022-03-04 – 2022-03-07 (×13): 1000 mg via ORAL
  Filled 2022-03-04 (×13): qty 2

## 2022-03-04 NOTE — Progress Notes (Signed)
13:45- Patient arrived onto unit into room 2W08 via wheelchair. Maximum assistance to stand a pivot to bed from wheelchair. Primarily spanish speaking, A&O x4. On RA. VSS. Complaining of pelvic pain 10/10- PRN 15mg  oxycodone given

## 2022-03-04 NOTE — Progress Notes (Signed)
Inpatient Rehab Admissions Coordinator:   I do not have a CIR bed for this Pt. Today. I will continue to follow for potential admit pending bed availability. Pt. Updated with assistance from interpreter. I also attempted again to contact Pt.'s boss Christy Sartorius, who told pt he would arrange someone to help upon d/c. I left a vm with request for callback.   Clemens Catholic, [place], [place] Admissions Coordinator  [phone] ([place]) [phone] (office)
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
## 2022-03-04 NOTE — Plan of Care (Signed)
  Problem: Education: Goal: Knowledge of General Education information will improve Description: Including pain rating scale, medication(s)/side effects and non-pharmacologic comfort measures Outcome: Progressing   Problem: Health Behavior/Discharge Planning: Goal: Ability to manage health-related needs will improve Outcome: Progressing   Problem: Clinical Measurements: Goal: Ability to maintain clinical measurements within normal limits will improve Outcome: Progressing Goal: Will remain free from infection Outcome: Progressing   Problem: Activity: Goal: Risk for activity intolerance will decrease Outcome: Progressing   Problem: Nutrition: Goal: Adequate nutrition will be maintained Outcome: Progressing   Problem: Coping: Goal: Level of anxiety will decrease Outcome: Progressing   Problem: Elimination: Goal: Will not experience complications related to bowel motility Outcome: Progressing   Problem: Pain Managment: Goal: General experience of comfort will improve Outcome: Progressing   Problem: Safety: Goal: Ability to remain free from injury will improve Outcome: Progressing   
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
## 2022-03-05 ENCOUNTER — Inpatient Hospital Stay (HOSPITAL_COMMUNITY): Payer: Self-pay
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
## 2022-03-05 MED ORDER — METHOCARBAMOL 500 MG PO TABS
1000.0000 mg | ORAL_TABLET | Freq: Three times a day (TID) | ORAL | Status: DC
  Administered 2022-03-05 – 2022-03-06 (×3): 1000 mg via ORAL
  Filled 2022-03-05 (×4): qty 2

## 2022-03-05 MED ORDER — PROPOFOL 1000 MG/100ML IV EMUL
INTRAVENOUS | Status: AC
  Filled 2022-03-05: qty 100

## 2022-03-05 MED ORDER — PHENYLEPHRINE HCL-NACL 20-0.9 MG/250ML-% IV SOLN
INTRAVENOUS | Status: AC
  Filled 2022-03-05: qty 500
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
## 2022-03-05 NOTE — Progress Notes (Signed)
0730- Surgical PA at bedside. Notified of patient increased pain and swelling to right hip after working with PT/OT yesterday. No new imaging orders at this time.   0920- Orders placed for MRI of right hip
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
## 2022-03-05 NOTE — Plan of Care (Signed)

## 2022-03-05 NOTE — TOC Progression Note (Addendum)
Transition of Care [facility]) - Progression Note    Patient Details  Name: Joshua Orozco MRN: [id number] Date of Birth: 06-20-1986  Transition of Care [facility]) CM/SW [place], RN Phone Number: 03/05/2022, 11:40 AM  Clinical Narrative:    CM met with the patient at the bedside to discuss transitions of care needs.  Rob Bunting, [place] Interpretor and [place] OT with [employer] were present in the room.  The patient is continuing to have pelvic pain and mobility difficulty and is being followed by the [facility] Team for needed therapy - but pain, mobility and balance are still a remaining concern at this time.  CIR is continuing to follow the patient for possible bed placement.  Pelvic xrays were repeated and no surgical intervention was needed per Trauma PA notes - Dr. Marcelino Scot to follow.  The patient states that he lives in a boarding house with a landlord available for assistance along with provision of support/ transportation through his boss, Christy Sartorius.  The patient has family outside of the country.  The patient is continuing to progress with [facility] Team for therapies and strengthening.  CIR is continuing to follow the patient for disposition at this time.   Expected Discharge Plan: [place] Barriers to Discharge: Inadequate or no insurance, Family Issues  Expected Discharge Plan and Services Expected Discharge Plan: [place] In-house Referral: Clinical Social Work, PCP / Curator Services: CM Consult, Medication Assistance, [facility]   Living arrangements for the past 2 months: [place]                 DME Arranged: [facility] bed DME Agency: [employer]                   Social Determinants of Health (SDOH) Interventions    Readmission Risk Interventions     No data to display

## 2022-03-06 MED ORDER — METHOCARBAMOL 500 MG PO TABS
1000.0000 mg | ORAL_TABLET | Freq: Four times a day (QID) | ORAL | Status: DC
  Administered 2022-03-06 – 2022-03-07 (×4): 1000 mg via ORAL
  Filled 2022-03-06 (×4): qty 2

## 2022-03-06 MED ORDER — KETOROLAC TROMETHAMINE 15 MG/ML IJ SOLN
15.0000 mg | Freq: Four times a day (QID) | INTRAMUSCULAR | Status: DC
  Administered 2022-03-06 – 2022-03-07 (×5): 15 mg via INTRAVENOUS
  Filled 2022-03-06 (×5): qty 1

## 2022-03-06 MED ORDER — HYDROMORPHONE HCL 1 MG/ML IJ SOLN: 0.5000 mg | Freq: Four times a day (QID) | INTRAMUSCULAR | Status: DC | PRN
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
## 2022-03-06 NOTE — Progress Notes (Signed)
Trauma Event Note   Event Summary: Rounded on patient, patient sitting in reclining chair upon assessment. Patient stated that the change in pain medications yesterday has helped a lot with the pain he was feeling in his right thigh yesterday. Patient stated he was able to work with PT today and had less pain than in days past. Patient A&O and in good spirits at time of assessment.   Last imported Vital Signs BP (!) 105/57 (BP Location: Left Arm)   Pulse 62   Temp (!) 97.4 F (36.3 C) (Oral)   Resp 15   Ht 5\' 6"  (1.676 m)   Wt 131 lb 9.8 oz (59.7 kg)   SpO2 99%   BMI 21.24 kg/m   Trending CBC No results for input(s): "WBC", "HGB", "HCT", "PLT" in the last 72 hours.  Trending Coag's No results for input(s): "APTT", "INR" in the last 72 hours.  Trending BMET No results for input(s): "NA", "K", "CL", "CO2", "BUN", "CREATININE", "GLUCOSE" in the last 72 hours.     Trauma Response RN  Please call TRN at [phone] for further assistance.
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
## 2022-03-06 NOTE — Progress Notes (Signed)
IP rehab admissions - I am planning to admit patient to inpatient rehab tomorrow, Saturday.  Rehab MD will review in am.  Please have nurse call report to [phone] after 12 noon to confirm admission and room number.  Call for questions.  [phone]
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
## 2022-03-07 ENCOUNTER — Encounter (HOSPITAL_COMMUNITY): Payer: Self-pay | Admitting: Physical Medicine and Rehabilitation

## 2022-03-07 ENCOUNTER — Inpatient Hospital Stay (HOSPITAL_COMMUNITY): Payer: Self-pay

## 2022-03-07 ENCOUNTER — Other Ambulatory Visit: Payer: Self-pay

## 2022-03-07 ENCOUNTER — Inpatient Hospital Stay (HOSPITAL_COMMUNITY)
Admission: RE | Admit: 2022-03-07 | Discharge: 2022-03-22 | DRG: 948 | Disposition: A | Discharge status: Home Health | Payer: Self-pay | Source: Intra-hospital | Attending: Physical Medicine and Rehabilitation | Admitting: Physical Medicine and Rehabilitation

## 2022-03-07 DIAGNOSIS — F1010 Alcohol abuse, uncomplicated: Secondary | ICD-10-CM | POA: Diagnosis present

## 2022-03-07 DIAGNOSIS — R5381 Other malaise: Principal | ICD-10-CM | POA: Diagnosis present

## 2022-03-07 DIAGNOSIS — I952 Hypotension due to drugs: Secondary | ICD-10-CM | POA: Diagnosis present

## 2022-03-07 DIAGNOSIS — S329XXD Fracture of unspecified parts of lumbosacral spine and pelvis, subsequent encounter for fracture with routine healing: Secondary | ICD-10-CM

## 2022-03-07 DIAGNOSIS — T1490XA Injury, unspecified, initial encounter: Secondary | ICD-10-CM | POA: Diagnosis present

## 2022-03-07 DIAGNOSIS — S62304D Unspecified fracture of fourth metacarpal bone, right hand, subsequent encounter for fracture with routine healing: Secondary | ICD-10-CM

## 2022-03-07 DIAGNOSIS — D62 Acute posthemorrhagic anemia: Secondary | ICD-10-CM | POA: Diagnosis present

## 2022-03-07 DIAGNOSIS — K5900 Constipation, unspecified: Secondary | ICD-10-CM | POA: Diagnosis present

## 2022-03-07 DIAGNOSIS — T3995XD Adverse effect of unspecified nonopioid analgesic, antipyretic and antirheumatic, subsequent encounter: Secondary | ICD-10-CM

## 2022-03-07 DIAGNOSIS — S7011XD Contusion of right thigh, subsequent encounter: Secondary | ICD-10-CM

## 2022-03-07 DIAGNOSIS — S62306D Unspecified fracture of fifth metacarpal bone, right hand, subsequent encounter for fracture with routine healing: Secondary | ICD-10-CM

## 2022-03-07 DIAGNOSIS — S3210XD Unspecified fracture of sacrum, subsequent encounter for fracture with routine healing: Secondary | ICD-10-CM

## 2022-03-07 IMAGING — DX DG HAND COMPLETE 3+V*R*
1 series · 3 of 3 positions shown · non-contrast
Comparison: Right hand radiographs [DATE]

CLINICAL DATA: Pain.

EXAM:
RIGHT HAND - COMPLETE 3+ VIEW

[Series 1: hand · 0.14mm/px · 3 of 3 slices shown]
[im 1/3]
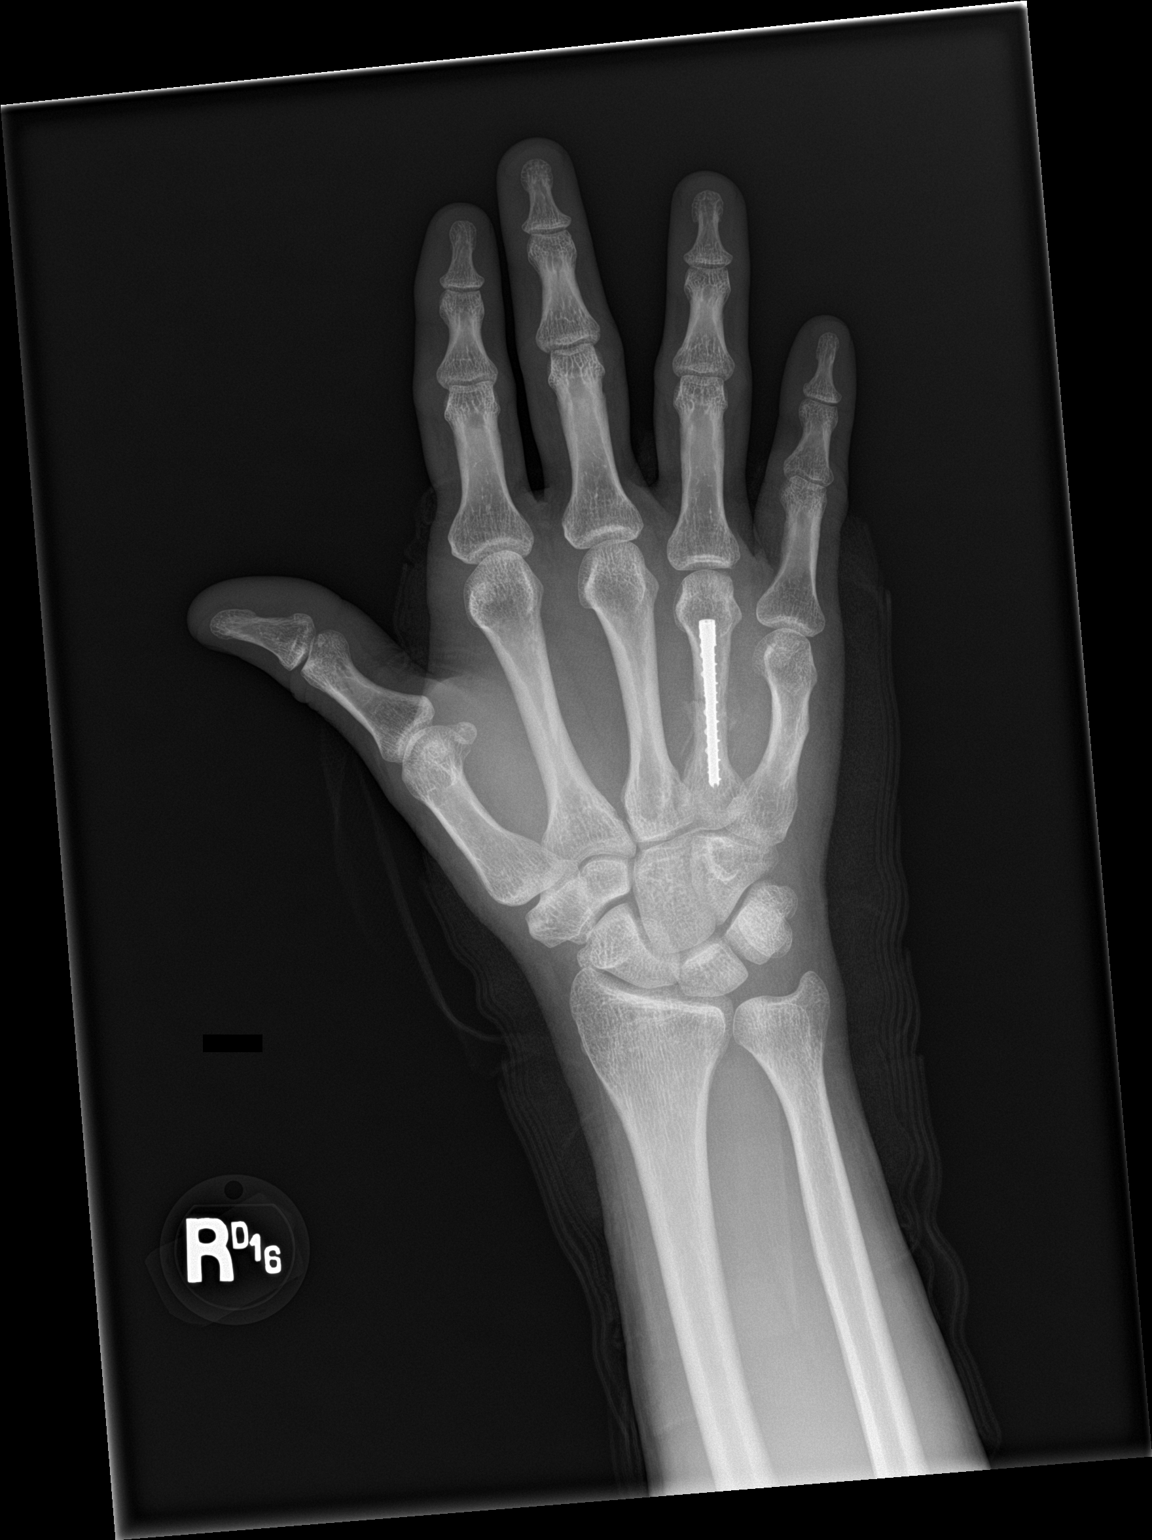
[im 2/3]
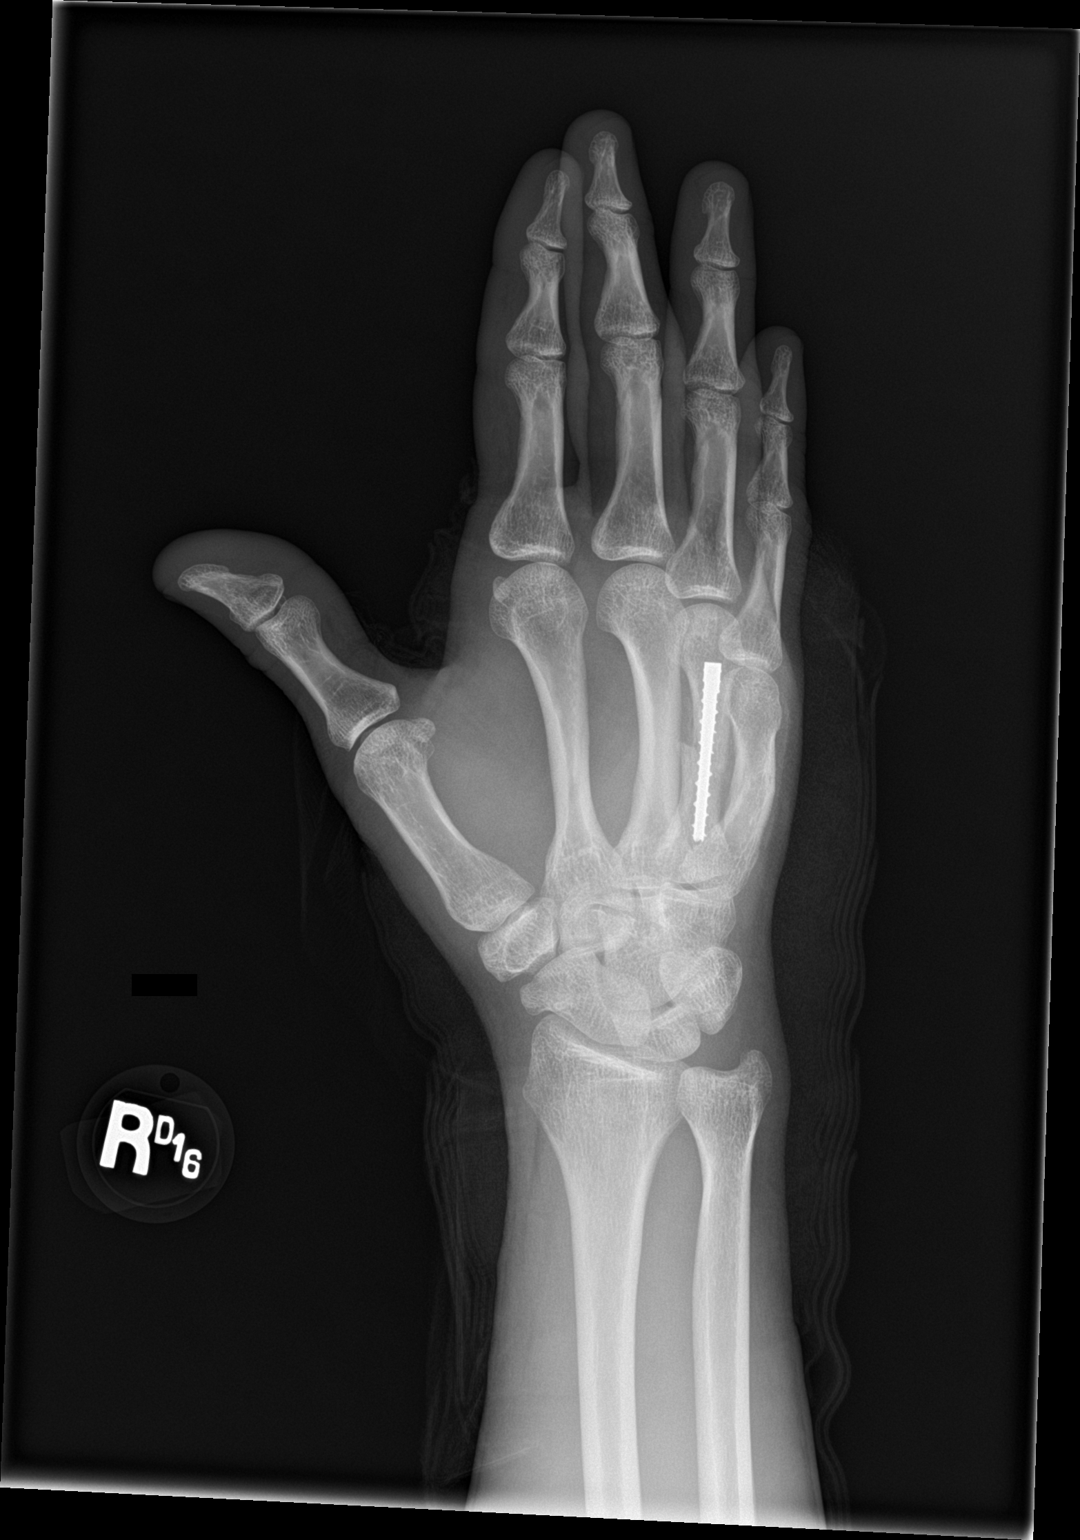
[im 3/3]
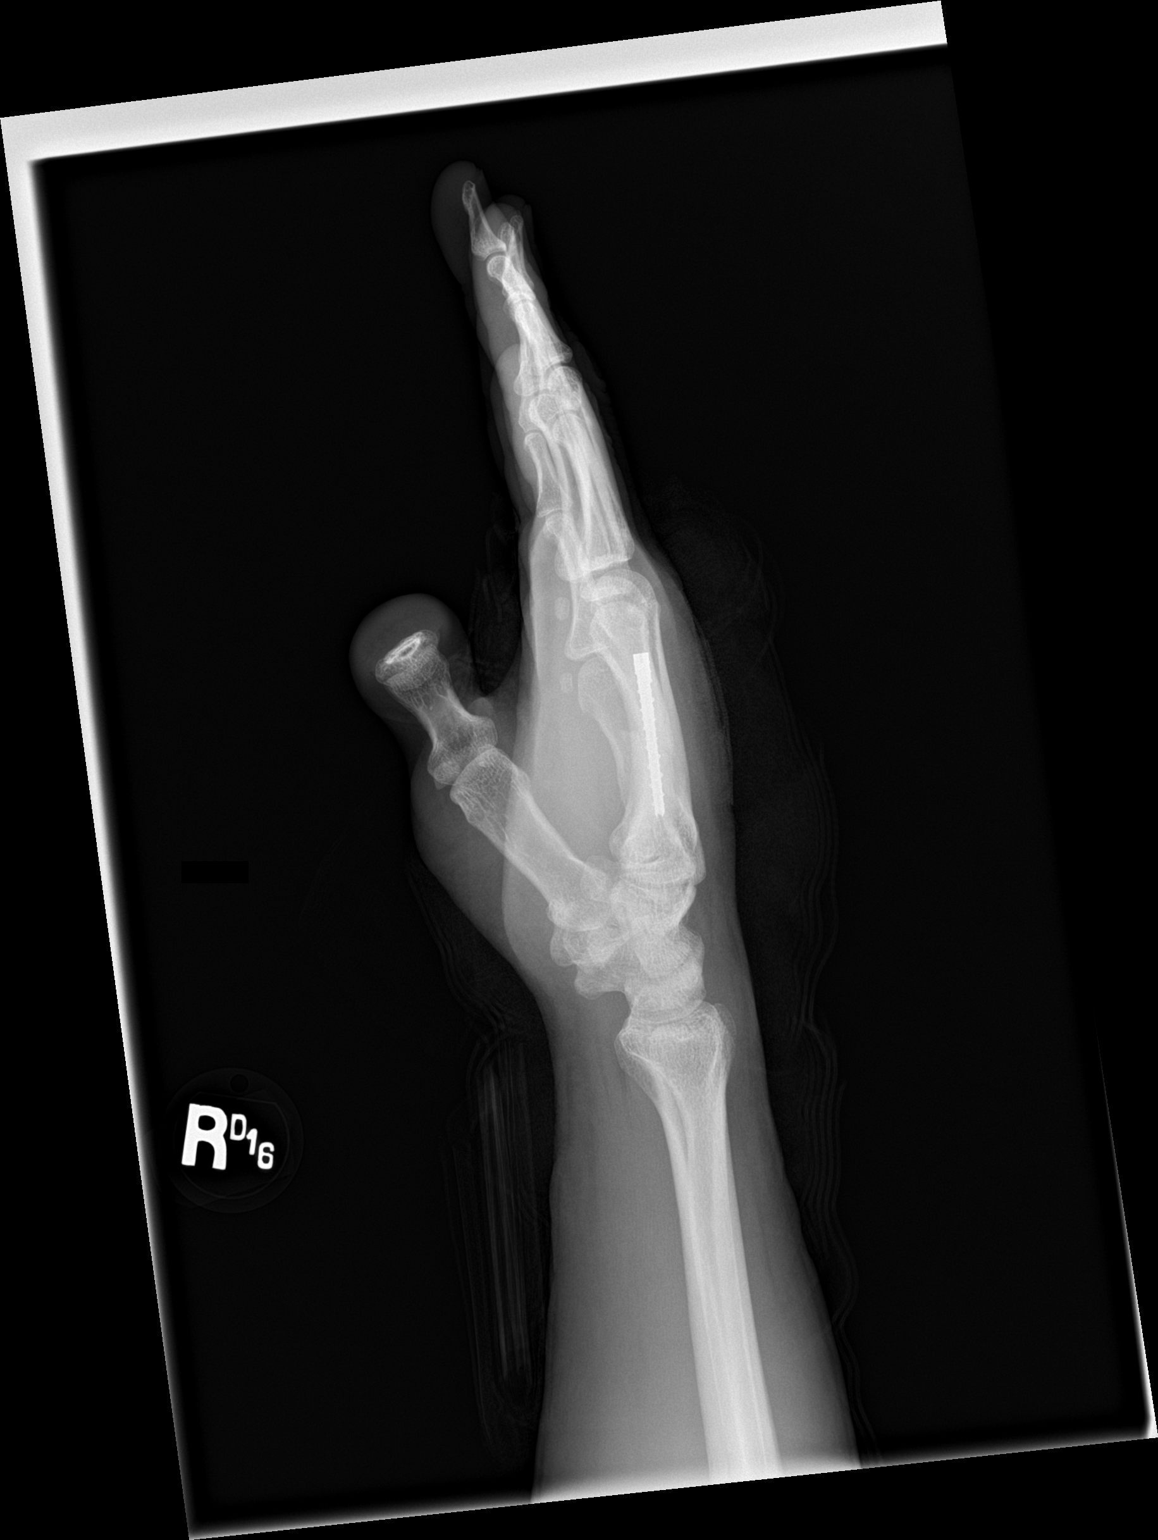

[3 of 3 positions shown; findings below may reference images not displayed]

FINDINGS: Intramedullary pin now in place at the fourth metacarpal. Persistent
offset noted on the oblique image 1 cortex. Alignment is otherwise
anatomic. Remote fifth metacarpal fracture noted.
IMPRESSION: 1. Interval placement of intramedullary pin in the fourth metacarpal
without radiographic evidence for complication.
2. Remote fifth metacarpal fracture.

## 2022-03-07 MED ORDER — OXYCODONE HCL 5 MG PO TABS
10.0000 mg | ORAL_TABLET | ORAL | Status: DC | PRN
  Administered 2022-03-07 – 2022-03-13 (×11): 15 mg via ORAL
  Administered 2022-03-15: 10 mg via ORAL
  Administered 2022-03-16 – 2022-03-17 (×2): 15 mg via ORAL
  Filled 2022-03-07 (×11): qty 3
  Filled 2022-03-07: qty 2
  Filled 2022-03-07 (×5): qty 3

## 2022-03-07 MED ORDER — TRAMADOL HCL 50 MG PO TABS
50.0000 mg | ORAL_TABLET | Freq: Four times a day (QID) | ORAL | Status: DC | PRN
  Administered 2022-03-11 – 2022-03-20 (×3): 50 mg via ORAL
  Filled 2022-03-07 (×3): qty 1

## 2022-03-07 MED ORDER — LIDOCAINE HCL URETHRAL/MUCOSAL 2 % EX GEL: CUTANEOUS | Status: DC | PRN

## 2022-03-07 MED ORDER — THIAMINE HCL 100 MG PO TABS
100.0000 mg | ORAL_TABLET | Freq: Every day | ORAL | Status: DC
  Administered 2022-03-07 – 2022-03-22 (×16): 100 mg via ORAL
  Filled 2022-03-07 (×16): qty 1

## 2022-03-07 MED ORDER — TRAZODONE HCL 50 MG PO TABS
25.0000 mg | ORAL_TABLET | Freq: Every evening | ORAL | Status: DC | PRN
  Filled 2022-03-07: qty 1

## 2022-03-07 MED ORDER — POLYETHYLENE GLYCOL 3350 17 G PO PACK
17.0000 g | PACK | Freq: Two times a day (BID) | ORAL | Status: DC
  Filled 2022-03-07 (×8): qty 1

## 2022-03-07 MED ORDER — PROCHLORPERAZINE 25 MG RE SUPP: 12.5000 mg | Freq: Four times a day (QID) | RECTAL | Status: DC | PRN

## 2022-03-07 MED ORDER — DIPHENHYDRAMINE HCL 12.5 MG/5ML PO ELIX: 12.5000 mg | ORAL_SOLUTION | Freq: Four times a day (QID) | ORAL | Status: DC | PRN

## 2022-03-07 MED ORDER — GUAIFENESIN-DM 100-10 MG/5ML PO SYRP
5.0000 mL | ORAL_SOLUTION | Freq: Four times a day (QID) | ORAL | Status: DC | PRN
  Administered 2022-03-19: 10 mL via ORAL
  Filled 2022-03-07: qty 10

## 2022-03-07 MED ORDER — BISACODYL 10 MG RE SUPP: 10.0000 mg | Freq: Every day | RECTAL | Status: DC | PRN

## 2022-03-07 MED ORDER — FLEET ENEMA 7-19 GM/118ML RE ENEM
1.0000 | ENEMA | Freq: Once | RECTAL | Status: DC | PRN
Start: 2022-03-07 — End: 2022-03-22

## 2022-03-07 MED ORDER — ACETAMINOPHEN 325 MG PO TABS
650.0000 mg | ORAL_TABLET | Freq: Three times a day (TID) | ORAL | Status: DC
  Administered 2022-03-07 – 2022-03-22 (×59): 650 mg via ORAL
  Filled 2022-03-07 (×59): qty 2

## 2022-03-07 MED ORDER — ADULT MULTIVITAMIN W/MINERALS CH
1.0000 | ORAL_TABLET | Freq: Every day | ORAL | Status: DC
  Administered 2022-03-08 – 2022-03-22 (×15): 1 via ORAL
  Filled 2022-03-07 (×15): qty 1

## 2022-03-07 MED ORDER — METHOCARBAMOL 500 MG PO TABS
1000.0000 mg | ORAL_TABLET | Freq: Four times a day (QID) | ORAL | Status: DC
  Administered 2022-03-07 – 2022-03-12 (×20): 1000 mg via ORAL
  Filled 2022-03-07 (×20): qty 2

## 2022-03-07 MED ORDER — ALUM & MAG HYDROXIDE-SIMETH 200-200-20 MG/5ML PO SUSP: 30.0000 mL | ORAL | Status: DC | PRN

## 2022-03-07 MED ORDER — PROCHLORPERAZINE EDISYLATE 10 MG/2ML IJ SOLN: 5.0000 mg | Freq: Four times a day (QID) | INTRAMUSCULAR | Status: DC | PRN

## 2022-03-07 MED ORDER — PROCHLORPERAZINE MALEATE 5 MG PO TABS: 5.0000 mg | ORAL_TABLET | Freq: Four times a day (QID) | ORAL | Status: DC | PRN

## 2022-03-07 MED ORDER — ENOXAPARIN SODIUM 30 MG/0.3ML IJ SOSY
30.0000 mg | PREFILLED_SYRINGE | Freq: Two times a day (BID) | INTRAMUSCULAR | Status: DC
  Administered 2022-03-07 – 2022-03-21 (×29): 30 mg via SUBCUTANEOUS
  Filled 2022-03-07 (×29): qty 0.3

## 2022-03-07 MED ORDER — DOCUSATE SODIUM 100 MG PO CAPS
100.0000 mg | ORAL_CAPSULE | Freq: Two times a day (BID) | ORAL | Status: DC
  Administered 2022-03-07 – 2022-03-12 (×9): 100 mg via ORAL
  Filled 2022-03-07 (×10): qty 1

## 2022-03-07 MED ORDER — KETOROLAC TROMETHAMINE 15 MG/ML IJ SOLN
15.0000 mg | Freq: Four times a day (QID) | INTRAMUSCULAR | Status: AC
Start: 2022-03-07 — End: 2022-03-12
  Administered 2022-03-07 – 2022-03-12 (×16): 15 mg via INTRAVENOUS
  Filled 2022-03-07 (×18): qty 1

## 2022-03-07 MED ORDER — POLYETHYLENE GLYCOL 3350 17 G PO PACK
17.0000 g | PACK | Freq: Every day | ORAL | Status: DC | PRN
  Administered 2022-03-09: 17 g via ORAL

## 2022-03-07 MED ORDER — ACETAMINOPHEN 325 MG PO TABS: 325.0000 mg | ORAL_TABLET | ORAL | Status: DC | PRN

## 2022-03-07 MED ORDER — FOLIC ACID 1 MG PO TABS
1.0000 mg | ORAL_TABLET | Freq: Every day | ORAL | Status: DC
  Administered 2022-03-07 – 2022-03-22 (×16): 1 mg via ORAL
  Filled 2022-03-07 (×16): qty 1

## 2022-03-07 NOTE — Progress Notes (Signed)
Inpatient Rehabilitation Admission Medication Review by a Pharmacist  A complete drug regimen review was completed for this patient to identify any potential clinically significant medication issues.  High Risk Drug Classes Is patient taking? Indication by Medication  Antipsychotic Yes Compazine- nausea  Anticoagulant Yes Enoxaparin- DVT ppx  Antibiotic No   Opioid Yes Oxycodone PRN and tramadol PRN- pain   Antiplatelet No   Hypoglycemics/insulin No   Vasoactive Medication No   Chemotherapy No   Other Yes APAP, Tordol and robaxin - pain Folic acid and thiamine - alcohol intoxication   Benadryl- itching Trazodone- sleep     Type of Medication Issue Identified Description of Issue Recommendation(s)  Drug Interaction(s) (clinically significant)     Duplicate Therapy     Allergy     No Medication Administration End Date     Incorrect Dose     Additional Drug Therapy Needed     Significant med changes from prior encounter (inform family/care partners about these prior to discharge).    Other       Clinically significant medication issues were identified that warrant physician communication and completion of prescribed/recommended actions by midnight of the next day:  No   Pharmacist comments: None  Time spent performing this drug regimen review (minutes):  20   Rushie Goltz 03/07/2022 2:46 PM
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
## 2022-03-08 DIAGNOSIS — S62304D Unspecified fracture of fourth metacarpal bone, right hand, subsequent encounter for fracture with routine healing: Secondary | ICD-10-CM

## 2022-03-08 DIAGNOSIS — N3289 Other specified disorders of bladder: Secondary | ICD-10-CM
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
## 2022-03-08 NOTE — Plan of Care (Signed)
Problem: RH Balance Goal: LTG Patient will maintain dynamic standing with ADLs (OT) Description: LTG:  Patient will maintain dynamic standing balance with assist during activities of daily living (OT)  Flowsheets (Taken 03/08/2022 1539) LTG: Pt will maintain dynamic standing balance during ADLs with: Independent with assistive device   Problem: Sit to Stand Goal: LTG:  Patient will perform sit to stand in prep for activites of daily living with assistance level (OT) Description: LTG:  Patient will perform sit to stand in prep for activites of daily living with assistance level (OT) Flowsheets (Taken 03/08/2022 1539) LTG: PT will perform sit to stand in prep for activites of daily living with assistance level: Independent with assistive device   Problem: RH Eating Goal: LTG Patient will perform eating w/assist, cues/equip (OT) Description: LTG: Patient will perform eating with assist, with/without cues using equipment (OT) Flowsheets (Taken 03/08/2022 1539) LTG: Pt will perform eating with assistance level of: Independent with assistive device    Problem: RH Grooming Goal: LTG Patient will perform grooming w/assist,cues/equip (OT) Description: LTG: Patient will perform grooming with assist, with/without cues using equipment (OT) Flowsheets (Taken 03/08/2022 1539) LTG: Pt will perform grooming with assistance level of: Independent with assistive device    Problem: RH Bathing Goal: LTG Patient will bathe all body parts with assist levels (OT) Description: LTG: Patient will bathe all body parts with assist levels (OT) Flowsheets (Taken 03/08/2022 1539) LTG: Pt will perform bathing with assistance level/cueing: Independent with assistive device    Problem: RH Dressing Goal: LTG Patient will perform upper body dressing (OT) Description: LTG Patient will perform upper body dressing with assist, with/without cues (OT). Flowsheets (Taken 03/08/2022 1539) LTG: Pt will perform upper body dressing  with assistance level of: Independent with assistive device Goal: LTG Patient will perform lower body dressing w/assist (OT) Description: LTG: Patient will perform lower body dressing with assist, with/without cues in positioning using equipment (OT) Flowsheets (Taken 03/08/2022 1539) LTG: Pt will perform lower body dressing with assistance level of: Independent with assistive device   Problem: RH Toileting Goal: LTG Patient will perform toileting task (3/3 steps) with assistance level (OT) Description: LTG: Patient will perform toileting task (3/3 steps) with assistance level (OT)  Flowsheets (Taken 03/08/2022 1539) LTG: Pt will perform toileting task (3/3 steps) with assistance level: Independent with assistive device   Problem: RH Simple Meal Prep Goal: LTG Patient will perform simple meal prep w/assist (OT) Description: LTG: Patient will perform simple meal prep with assistance, with/without cues (OT). Flowsheets (Taken 03/08/2022 1539) LTG: Pt will perform simple meal prep with assistance level of: Independent with assistive device   Problem: RH Laundry Goal: LTG Patient will perform laundry w/assist, cues (OT) Description: LTG: Patient will perform laundry with assistance, with/without cues (OT). Flowsheets (Taken 03/08/2022 1539) LTG: Pt will perform laundry with assistance level of: Independent with assistive device   Problem: RH Light Housekeeping Goal: LTG Patient will perform light housekeeping w/assist (OT) Description: LTG: Patient will perform light housekeeping with assistance, with/without cues (OT). Flowsheets (Taken 03/08/2022 1539) LTG: Pt will perform light housekeeping with assistance level of: Minimal Assistance - Patient > 75%   Problem: RH Toilet Transfers Goal: LTG Patient will perform toilet transfers w/assist (OT) Description: LTG: Patient will perform toilet transfers with assist, with/without cues using equipment (OT) Flowsheets (Taken 03/08/2022 1539) LTG: Pt  will perform toilet transfers with assistance level of: Independent with assistive device   Problem: RH Tub/Shower Transfers Goal: LTG Patient will perform tub/shower transfers w/assist (OT)  Description: LTG: Patient will perform tub/shower transfers with assist, with/without cues using equipment (OT) Flowsheets (Taken 03/08/2022 1539) LTG: Pt will perform tub/shower stall transfers with assistance level of: Independent with assistive device
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
## 2022-03-09 DIAGNOSIS — I952 Hypotension due to drugs: Secondary | ICD-10-CM

## 2022-03-09 DIAGNOSIS — S62394D Other fracture of fourth metacarpal bone, right hand, subsequent encounter for fracture with routine healing: Secondary | ICD-10-CM

## 2022-03-09 DIAGNOSIS — R001 Bradycardia, unspecified: Secondary | ICD-10-CM

## 2022-03-09 LAB — CBC WITH DIFFERENTIAL/PLATELET
Abs Immature Granulocytes: 0.04 10*3/uL (ref 0.00–0.07)
Basophils Absolute: 0 10*3/uL (ref 0.0–0.1)
Basophils Relative: 0 %
Eosinophils Absolute: 0.1 10*3/uL (ref 0.0–0.5)
Eosinophils Relative: 2 %
HCT: 30.5 % — ABNORMAL LOW (ref 39.0–52.0)
Hemoglobin: 10 g/dL — ABNORMAL LOW (ref 13.0–17.0)
Immature Granulocytes: 1 %
Lymphocytes Relative: 21 %
Lymphs Abs: 1.3 10*3/uL (ref 0.7–4.0)
MCH: 30 pg (ref 26.0–34.0)
MCHC: 32.8 g/dL (ref 30.0–36.0)
MCV: 91.6 fL (ref 80.0–100.0)
Monocytes Absolute: 0.4 10*3/uL (ref 0.1–1.0)
Monocytes Relative: 7 %
Neutro Abs: 4 10*3/uL (ref 1.7–7.7)
Neutrophils Relative %: 69 %
Platelets: 343 10*3/uL (ref 150–400)
RBC: 3.33 MIL/uL — ABNORMAL LOW (ref 4.22–5.81)
RDW: 12.4 % (ref 11.5–15.5)
WBC: 5.9 10*3/uL (ref 4.0–10.5)
nRBC: 0 % (ref 0.0–0.2)

## 2022-03-09 LAB — COMPREHENSIVE METABOLIC PANEL
ALT: 37 U/L (ref 0–44)
AST: 20 U/L (ref 15–41)
Albumin: 3.1 g/dL — ABNORMAL LOW (ref 3.5–5.0)
Alkaline Phosphatase: 83 U/L (ref 38–126)
Anion gap: 8 (ref 5–15)
BUN: 23 mg/dL — ABNORMAL HIGH (ref 6–20)
CO2: 26 mmol/L (ref 22–32)
Calcium: 9.2 mg/dL (ref 8.9–10.3)
Chloride: 104 mmol/L (ref 98–111)
Creatinine, Ser: 1.18 mg/dL (ref 0.61–1.24)
GFR, Estimated: >60 mL/min (ref >60)
Glucose, Bld: 90 mg/dL (ref 70–99)
Potassium: 4.1 mmol/L (ref 3.5–5.1)
Sodium: 138 mmol/L (ref 135–145)
Total Bilirubin: 0.3 mg/dL (ref 0.3–1.2)
Total Protein: 6.1 g/dL — ABNORMAL LOW (ref 6.5–8.1)

## 2022-03-09 NOTE — Progress Notes (Signed)
Patient ID: Joshua Orozco, male   DOB: 1985-10-15, 36 y.o.   MRN: [id number]  This SW covering for primary SW, Lavera Guise.   Pt not in room to complete assessment at time of visit. Efforts will continue to be made to follow-up with pt. Statement of service left in room.   Cecile Sheerer, MSW, LCSWA Office: [phone] Cell: [phone] Fax: [phone]

## 2022-03-09 NOTE — Progress Notes (Signed)
Physical Therapy Session Note  Patient Details  Name: Panayiotis [place] MRN: [id number] Date of Birth: 12/12/1985  Today's Date: 03/09/2022 PT Individual Time: 1100-1155 PT Individual Time Calculation (min): 55 min   Short Term Goals: Week 1:  PT Short Term Goal 1 (Week 1): Pt will transfer sup <> sit w/ CGA. PT Short Term Goal 2 (Week 1): Pt will transfer sit to stand w/ CGA PT Short Term Goal 3 (Week 1): Pt will amb w/ platform walker and CGA x 70' PT Short Term Goal 4 (Week 1): Pt will assess stairs.  Skilled Therapeutic Interventions/Progress Updates:   Received pt semi-reclined in bed, pt agreeable to PT treatment, and reported pain in pelvis with mobility - RN notified and present to administer pain medication. Session with emphasis on functional mobility/transfers, generalized strengthening and endurance, dynamic standing balance/coordination, simulated car transfers, gait training. Pt transferred semi-reclined<>sitting EOB with HOB elevated and use of bedrails with supervision using gait belt as leg lifter, using helicopter method to sit EOB. Pt transferred bed<>WC stand<>pivot with R PFRW and min A but not putting any weight through LEs and boosting up on RW with UEs - cues to encourage weight bearing through legs. Donned shoes with supervision and increased time using shoe horn and transported to/from room in [facility] dependently for time management and energy conservation purposes. Pt reports his boss will pick him up upon D/C. Pt ambulated 65ft with R PFRW and min A and performed ambulatory simulated car transfer with R PFRW and min A overall. Pt required use of leg lifter and gait belt to "helicopter" both LEs in/out car. Pt experienced sudden INTENSE pain and educated on technique for pursed lip breathing while taking water break - then pt requested to practice again. Practiced x 1 more time getting LEs in/out of car with cues to exhale when expending effort. Pt reported he will have assist the day  of D/C but after that none, he also reports having 4 STE with no rails and stated his landlord would not allow a ramp to be put in. Pt performed seated LAQ x10 bilaterally while discussing home environment and assist upon discharge. Returned to room and pt agreed to remain sitting up for lunch. Concluded session with pt sitting in WC, needs within reach, and seatbelt alarm on.   Therapy Documentation Precautions:  Precautions Precautions: Fall Required Braces or Orthoses: Splint/Cast Splint/Cast: R post op dressing Restrictions Weight Bearing Restrictions: Yes RUE Weight Bearing: Non weight bearing RLE Weight Bearing: Weight bearing as tolerated LLE Weight Bearing: Weight bearing as tolerated Other Position/Activity Restrictions: NWB through hand, may use platform walker.   Therapy/Group: Individual Therapy Martin Majestic PT, DPT ' 03/09/2022, 6:56 AM

## 2022-03-09 NOTE — Progress Notes (Signed)
Inpatient Rehabilitation  Patient information reviewed and entered into eRehab system by Asiah Browder Brytney Somes, OTR/L.   Information including medical coding, functional ability and quality indicators will be reviewed and updated through discharge.    
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
## 2022-03-09 NOTE — Care Management (Signed)
Inpatient Rehabilitation Center Individual Statement of Services  Patient Name:  Joshua Orozco  Date:  03/09/2022  Welcome to the Inpatient Rehabilitation Center.  Our goal is to provide you with an individualized program based on your diagnosis and situation, designed to meet your specific needs.  With this comprehensive rehabilitation program, you will be expected to participate in at least 3 hours of rehabilitation therapies Monday-Friday, with modified therapy programming on the weekends.  Your rehabilitation program will include the following services:  Physical Therapy (PT), Occupational Therapy (OT), 24 hour per day rehabilitation nursing, Therapeutic Recreaction (TR), Psychology, Neuropsychology, Care Coordinator, Rehabilitation Medicine, Nutrition Services, Pharmacy Services, and Other  Weekly team conferences will be held on Wednesdays to discuss your progress.  Your Inpatient Rehabilitation Care Coordinator will talk with you frequently to get your input and to update you on team discussions.  Team conferences with you and your family in attendance may also be held.  Expected length of stay: 2 weeks    Overall anticipated outcome: Supervision to Modified Independent  Depending on your progress and recovery, your program may change. Your Inpatient Rehabilitation Care Coordinator will coordinate services and will keep you informed of any changes. Your Inpatient Rehabilitation Care Coordinator's name and contact numbers are listed  below.  The following services may also be recommended but are not provided by the Inpatient Rehabilitation Center:  Driving Evaluations Home Health Rehabiltiation Services Outpatient Rehabilitation Services Vocational Rehabilitation   Arrangements will be made to provide these services after discharge if needed.  Arrangements include referral to agencies that provide these services.  Your insurance has been verified to be:  Uninsured  Your primary doctor  is:  No PCP  Pertinent information will be shared with your doctor and your insurance company.  Inpatient Rehabilitation Care Coordinator:  Lavera Guise, [place] [phone] or [phone]  Information discussed with and copy given to patient by: Gretchen Short, 03/09/2022, 10:31 AM

## 2022-03-09 NOTE — Progress Notes (Signed)
Occupational Therapy Session Note  Patient Details  Name: Joshua Orozco MRN: [id number] Date of Birth: 1985/12/19  Today's Date: 03/09/2022 OT Individual Time: 0802-0900 & 1420-1530 OT Individual Time Calculation (min): 58 min & 70 min   Short Term Goals: Week 1:  OT Short Term Goal 1 (Week 1): Pt will complete LB dressing with supervision. OT Short Term Goal 2 (Week 1): Pt will complete ambulatory level toilet transfer using LRAD with supervision OT Short Term Goal 3 (Week 1): Pt will complete tub bench transfer with supervision OT Short Term Goal 4 (Week 1): Pt will complete toileting with supervision.  Skilled Therapeutic Interventions/Progress Updates:  Session 1 Skilled OT intervention completed with focus on functional transfers, unilateral UE support dynamic balance. Pt received upright in bed, c/o 7/10 pain in pelvic region, pre-medicated, with nurse aware and therapist offering rest breaks, breathing techniques and repositioning for pain reduction throughout. No interpreter present, however communication was effective enough for session without use of stratus. Pt requesting to use bathroom. Completed bed mobility with helicopter method with overall mod A with pt using gait belt on LLE for self-lifting with therapist assisting with RLE and trunk during transition. Completed sit > stand using R PFRW with heavy min A then min A stand pivot to w/c, with pt demonstrating almost a BLE lift off while using trunk vs pivot method with pt stating "this is easier for me with the pain." Dependent transfer into bathroom in w/c, then mod A sit > stand from w/c using R PFRW, with min A stand pivot <> BSC over toilet. Able to provide distant supervision for privacy with toileting. Pt was continent of BM and urinary void, with toileting completed with min A for donning pants over hips only with pt completing posterior pericare with min A for balance with R PFRW. Donned tennis shoes with use of LH shoe horn  with min A 2/2 grip socks, with education provided about using regular socks for efficiency. Dependently transported in w/c <> gym for time and pain management. Pt participated in standing and unilateral UE support dynamic balance task in prep for toileting and LB tasks at high low table with clip activity, with LUE only for placing/removing clips and WB through elbow on RUE. Pt with limited standing tolerance 2/2 pain, requiring standing rest breaks, heavy breathing techniques, however able to maintain dynamic balance with min A using R PFRW, and required intermittent seated rest breaks between trials. Education provided about relation of activity to toileting and discussion with pt about pt's goals with toileting without device in small bathroom however therapist educated on options of bringing BSC out into room vs going into bathroom without device but high goals set for ambulation without device. Min A stand pivot to EOB, with max A needed for EOB to supine with significant increase in pain. Nursing/MD aware of pain levels and notified of pt's request for further meds. Pt was left upright in bed, with bed alarm on and all needs in reach at end of session.  Session 2 Skilled OT intervention completed with focus on ambulatory/cardiovascular endurance, dynamic balance. Pt received seated in w/c, no 5/10 pain in L pelvic region, pre-medicated, increasing up to 8/10 with movement with therapist offering rest breaks and re-positioning throughout session for pain reduction. Therapist utilized [employer] interpreter 2/2 pt report that he prefers interpreter for 100% accuracy with expressing himself. Transported dependently in w/c <> room for time management. To promote unilateral UE support with dynamic balance, pt completed the  following in stance at the [facility] with R PFRW with CGA needed for balance: bells cancellation, speed dot test, maze. Pt did require intermittent seated rest breaks 2/2 fatigue and higher pain  levels, however motivated to continue. Pt with continued presentation of breath holding with challenging tasks, with education and cues needed to regulate breathing. To promote cardiovascular endurance needed for functional mobility, pt completed 5 mins in forwards direction on SCIFIT with LUE only. Pt expressing his personal goals, with request to focus mainly on walking and progressing to independence without a device as his home set up requires a fairly independent level. Education provided about purpose of tasks with weight shifting, balance and endurance as prep for ambulation and home management requirements as well as OT purpose with progression of self-care in relation to ambulatory goals. With straight path, pt ambulated 31 ft using R PFRW with CGA, however slow pace. Pt with good gait pattern with reciprocal foot placement and good adherence to only WB through R elbow only. Discussed need of PFRW at this time for helping off-load his LLE, as pt desires to try without device but this therapist advising against at this time and reasons why were communicated. Pt was left seated in w/c in room, with NT present at direct care handoff.   Therapy Documentation Precautions:  Precautions Precautions: Fall Required Braces or Orthoses: Splint/Cast Splint/Cast: R post op dressing Restrictions Weight Bearing Restrictions: Yes RUE Weight Bearing: Non weight bearing RLE Weight Bearing: Weight bearing as tolerated LLE Weight Bearing: Weight bearing as tolerated Other Position/Activity Restrictions: NWB through hand, may use platform walker.    Therapy/Group: Individual Therapy  Melvyn Novas, MS, OTR/L  03/09/2022, 7:42 AM

## 2022-03-10 LAB — CBC
HCT: 30.3 % — ABNORMAL LOW (ref 39.0–52.0)
Hemoglobin: 10.3 g/dL — ABNORMAL LOW (ref 13.0–17.0)
MCH: 30.7 pg (ref 26.0–34.0)
MCHC: 34 g/dL (ref 30.0–36.0)
MCV: 90.2 fL (ref 80.0–100.0)
Platelets: 327 10*3/uL (ref 150–400)
RBC: 3.36 MIL/uL — ABNORMAL LOW (ref 4.22–5.81)
RDW: 12.4 % (ref 11.5–15.5)
WBC: 5.5 10*3/uL (ref 4.0–10.5)
nRBC: 0 % (ref 0.0–0.2)

## 2022-03-10 MED ORDER — ASCORBIC ACID 500 MG PO TABS
1000.0000 mg | ORAL_TABLET | Freq: Every day | ORAL | Status: DC
Start: 2022-03-11 — End: 2022-03-22
  Administered 2022-03-11 – 2022-03-22 (×12): 1000 mg via ORAL
  Filled 2022-03-10 (×12): qty 2
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
## 2022-03-10 NOTE — Progress Notes (Signed)
Occupational Therapy Session Note  Patient Details  Name: Joshua Orozco MRN: [id number] Date of Birth: August 22, 1986  Today's Date: 03/10/2022 OT Individual Time: 0802-0900 & 1416-1530 OT Individual Time Calculation (min): 58 min & 74 min   Short Term Goals: Week 1:  OT Short Term Goal 1 (Week 1): Pt will complete LB dressing with supervision. OT Short Term Goal 2 (Week 1): Pt will complete ambulatory level toilet transfer using LRAD with supervision OT Short Term Goal 3 (Week 1): Pt will complete tub bench transfer with supervision OT Short Term Goal 4 (Week 1): Pt will complete toileting with supervision.  Skilled Therapeutic Interventions/Progress Updates:  Session 1 Skilled OT intervention completed with focus on ambulatory endurance in prep for home management, dynamic balance. Therapist utilized [employer] interpreter for effective communication throughout session per pt preference. Pt received upright in bed, 4/10 pain in L hip, pre-medicated, with therapist offering rest breaks and repositioning for pain reduction throughout. Pt reported taking a shower already, however when diving deeper into details pt has only sponge bathed and therapist spent time educating pt on OT purpose, options for seated bathing 2/2 pt's fear of being "too weak for shower" as well as potential recommendations for DME to continue seated showers at home. Completed bed mobility from upright in bed to EOB with supervision using leg lifter. Pt reporting he didn't move all night, with request to start session with walking to loosen up. Therapist educated pt on options and benefits of re-positioning throughout the night for comfort and preventing sores. Completed sit > stand with min A using R PFRW, then CGA stand pivot to w/c. With straight path in hallway, pt ambulated at [facility] level for about 10 ft total before reporting fatigue, with w/c follow. Transported dependently in w/c rest of way to gym and back to room. To promote  standing tolerance, weight shifting and dynamic balance needed for ambulatory requirements, pt participated in corn hole game with tossing on LUE only. Up to min A needed for balance especially with anterior propulsion with throw, with education provided about therapist letting his body feel the off balance to promote his balance abilities and self-correction needed for independence at home. Pt's BLE noticeably shaky after period of time in stance, with pt reporting fatigue. Back in room, pt completed sit > stand and stand pivot to recliner using R PFRW with CGA. Pt was left upright in recliner, with BLE elevated, and all needs in reach at end of session.  Session 2 Skilled OT intervention completed with focus on functional endurance within a shower context, transfers and pain management. Pt received upright in bed, with 7/10 pain in L hip, pre-medicated, with pt agreeable to shower to assist with pain reduction. Completed bed mobility with supervision with use of leg lifter for BLE. Completed sit > stand with CGA using R PFRW then ambulatory transfer with initial CGA increasing to min A for incline into bathroom to toilet 2/2 pain. Min A needed for doffing pants, then distant supervision provided for continent bowel and bladder episode. CGA for toileting steps in stance. Ambulatory transfer with CGA to tub bench in shower using R PFRW, and throughout remainder of session. Therapist applied water proof cover to R hand and IV prior to shower per MD. Completed all bathing with supervision A with use of LH sponge. Pt able to thread BLE into underwear/shorts with use of reacher with no assist however min A needed to pull up clothing over hips as pt reported being unable  2/2 pain. Donned shirt with set up A. Ambulatory transfer using R PFRW with min A to w/c, then pt completed donning of socks/shoes with set up A using sock aid and LH shoe horn. Completed grooming and oral care with mod I seated at sink. In hallway,  pt ambulated about 8 ft with CGA using PFRW however fatigue reported and request to return to room. Pt was left seated in w/c, with ice applied to L quad for pain relief, with belt alarm on, w/c leg rests adjusted for comfort and all needs in reach at end of session.   Therapy Documentation Precautions:  Precautions Precautions: Fall Required Braces or Orthoses: Splint/Cast Splint/Cast: R post op dressing Restrictions Weight Bearing Restrictions: Yes RUE Weight Bearing: Non weight bearing RLE Weight Bearing: Weight bearing as tolerated LLE Weight Bearing: Weight bearing as tolerated Other Position/Activity Restrictions: NWB through hand, may use platform walker.    Therapy/Group: Individual Therapy  Melvyn Novas, MS, OTR/L  03/10/2022, 7:45 AM

## 2022-03-10 NOTE — IPOC Note (Signed)
Overall Plan of Care [facility]) Patient Details Name: Joshua Orozco MRN: [id number] DOB: May 13, 1986  Admitting Diagnosis: Trauma  Hospital Problems: Principal Problem:   Trauma Active Problems:   Closed displaced fracture of fourth metacarpal bone of right hand with routine healing     Functional Problem List: Nursing Bowel, Edema, Endurance, Medication Management, Motor, Pain, Safety, Skin Integrity  PT Balance, Pain, Safety, Endurance, Motor  OT Balance, Edema, Endurance, Motor, Pain, Skin Integrity  SLP    TR         Basic ADL's: OT Eating, Grooming, Bathing, Dressing, Toileting     Advanced  ADL's: OT Simple Meal Preparation, Light Housekeeping, Laundry     Transfers: PT Bed Mobility, Bed to Chair, Set designer, Occupational psychologist, Research scientist (life sciences): PT Ambulation, Stairs     Additional Impairments: OT Fuctional Use of Upper Extremity  SLP        TR      Anticipated Outcomes Item Anticipated Outcome  Self Feeding mod I  Swallowing      Basic self-care  mod I  Toileting  mod I   Bathroom Transfers mod I  Bowel/Bladder  mod I  Transfers  supervision  Locomotion  supervision w/ LRAD  Communication     Cognition     Pain  < 3  Safety/Judgment  mod I   Therapy Plan: PT Intensity: Minimum of 1-2 x/day ,45 to 90 minutes PT Frequency: 5 out of 7 days PT Duration Estimated Length of Stay: 2 weeks OT Intensity: Minimum of 1-2 x/day, 45 to 90 minutes OT Frequency: 5 out of 7 days OT Duration/Estimated Length of Stay: 2 weeks     Team Interventions: Nursing Interventions Patient/Family Education, Bowel Management, Disease Management/Prevention, Pain Management, Medication Management, Skin Care/Wound Management, Discharge Planning  PT interventions Ambulation/gait training, Discharge planning, DME/adaptive equipment instruction, Functional mobility training, Therapeutic Activities, Pain management, UE/LE Strength taining/ROM, Community reintegration,  Neuromuscular re-education, Stair training, Therapeutic Exercise, UE/LE Coordination activities  OT Interventions Warden/ranger, Fish farm manager, Patient/family education, Therapeutic Activities, Wheelchair propulsion/positioning, Functional electrical stimulation, Psychosocial support, Therapeutic Exercise, Community reintegration, Functional mobility training, Self Care/advanced ADL retraining, UE/LE Strength taining/ROM, Discharge planning, Skin care/wound managment, UE/LE Coordination activities, Pain management, Neuromuscular re-education, Disease mangement/prevention, Splinting/orthotics  SLP Interventions    TR Interventions    SW/CM Interventions     Barriers to Discharge MD  Medical stability  Nursing Decreased caregiver support, Home environment access/layout, Incontinence, Wound Care, Lack of/limited family support, Insurance for SNF coverage, Weight bearing restrictions 1 level, 2 steps, no rails. Louann Sjogren can provide intermittent assist. Lives with landlord, limited assistance.  PT Inaccessible home environment, Decreased caregiver support, Weight bearing restrictions pt w/ 4 STE, no rails, minimal assistance from boss, NWB RUE.  OT Inaccessible home environment, Decreased caregiver support    SLP      SW       Team Discharge Planning: Destination: PT-Home ,OT- Home , SLP-  Projected Follow-up: PT-Home health PT, OT-  Outpatient OT, SLP-  Projected Equipment Needs: PT- , OT- To be determined, SLP-  Equipment Details: PT-TBD, OT-  Patient/family involved in discharge planning: PT- Patient,  OT-Patient, SLP-   MD ELOS: 2 weeks Medical Rehab Prognosis:  Excellent Assessment: The patient has been admitted for CIR therapies with the diagnosis of polytrauma The team will be addressing functional mobility, strength, stamina, balance, safety, adaptive techniques and equipment, self-care, bowel and bladder mgt, patient and caregiver education. Goals have  been set at  modI.Anticipated discharge destination is home.        See Team Conference Notes for weekly updates to the plan of care

## 2022-03-10 NOTE — Progress Notes (Signed)
Physical Therapy Session Note  Patient Details  Name: Joshua Orozco MRN: [id number] Date of Birth: May 20, 1986  Today's Date: 03/10/2022 PT Individual Time: 1100-1157 PT Individual Time Calculation (min): 57 min   Short Term Goals: Week 1:  PT Short Term Goal 1 (Week 1): Pt will transfer sup <> sit w/ CGA. PT Short Term Goal 2 (Week 1): Pt will transfer sit to stand w/ CGA PT Short Term Goal 3 (Week 1): Pt will amb w/ platform walker and CGA x 70' PT Short Term Goal 4 (Week 1): Pt will assess stairs.  Skilled Therapeutic Interventions/Progress Updates:   Received pt sitting in recliner, pt agreeable to PT treatment, and reported pain 6-7/10 in pelvis/sacrum - RN notified and present to administer pain medication. [employer] interpreter used during session per pt request. Session with emphasis on functional mobility/transfers, generalized strengthening and endurance, dynamic standing balance/coordination, stair navigation, and gait training. Pt transferred sit<>stand with R PFRW and min A and ambulated 43ft with R PFRW and CGA to WC. Pt transported to/from room in [facility] dependently for time management and energy conservation purposes. Demonstrated technique for stair navigation using PFRW ascending backwards and descending forwards. Pt transferred WC<>bottom of staircase with R PFRW and min A and ascended/descended 1 6in step using R PFRW and mod A with therapist stabilizing RW in front. However, pt experienced sudden intense pain in pelvis and unable to continue any further. Returned to [facility] to rest and pt reported the pain occurred when he had to place full weight through BLEs when lifting the walker up the step. Transported to dayroom and transferred sit<>stand with R PFRW and min A and ambulated 78ft with R PFRW and CGA with significantly increased time due to decreased cadence from pain. Returned to room and pt requested to return to bed. Sit<>stand with R PFRW and min A and pt ambulated 76ft with R PFRW and  CGA to bed. Doffed shoes with supervision and transferred sit<>supine using gait belt and leg lifter using helicopter method. Concluded session with pt semi-reclined in bed, needs within reach, and bed alarm on.   Therapy Documentation Precautions:  Precautions Precautions: Fall Required Braces or Orthoses: Splint/Cast Splint/Cast: R post op dressing Restrictions Weight Bearing Restrictions: Yes RUE Weight Bearing: Non weight bearing RLE Weight Bearing: Weight bearing as tolerated LLE Weight Bearing: Weight bearing as tolerated Other Position/Activity Restrictions: NWB through hand, may use platform walker.  Therapy/Group: Individual Therapy Martin Majestic PT, DPT  03/10/2022, 7:05 AM

## 2022-03-10 NOTE — Progress Notes (Signed)
Patient ID: Joshua Orozco, male   DOB: 09-Sep-1986, 36 y.o.   MRN: [id number]   SW met with patient, introduced self and explained role. Patient has reported he will not have physical assistance at home. Patient has reached out to family and friends and no one will be able to assist at discharge. Sw will continue to follow up with patient on progress.
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
## 2022-03-11 NOTE — Progress Notes (Signed)
Physical Therapy Session Note  Patient Details  Name: Joshua Orozco MRN: [id number] Date of Birth: May 14, 1986  Today's Date: 03/11/2022 PT Individual Time: 1108-1202 PT Individual Time Calculation (min): 54 min   Short Term Goals: Week 1:  PT Short Term Goal 1 (Week 1): Pt will transfer sup <> sit w/ CGA. PT Short Term Goal 2 (Week 1): Pt will transfer sit to stand w/ CGA PT Short Term Goal 3 (Week 1): Pt will amb w/ platform walker and CGA x 70' PT Short Term Goal 4 (Week 1): Pt will assess stairs.  Skilled Therapeutic Interventions/Progress Updates: Pt presented in bed agreeable to therapy. Pt states pain 7/10, nsg notified during session and pt received halfway through. Pt initially amicable to trying small stairs however due to receiving pain meds later in session did not want to attempt unless premedicated. Session therefore focused on therex and ambulation. Pt performed supine to sit with supervision and use of leg lifter. PTA donned shoes total A for time management. Performed ambulatory transfer to w/c with PFRW and supervision. Pt transported to rehab gym total A. Participated in [place], ball squeezes, and hamstring pulls with red theraband x 10 bilaterally. Pt required cues for breathing through activity as noted significant guarding and holding of breath with movement. Performed toe taps to 4 in step x 5 bilaterally with PTA encouraging engaging glutes for stabilization of pelvis. Pt ambulated ~77f with PFRW and CGA nearing close supervision. Pt again encouraged to engage glutes to stabilize pelvis with pt taking very small steps. Pt transported to day room and participated in KCornelius50cm/sec 30 sec x 3 for ROM and activity within pt's tolerance. Pt transported back to room and performed ambulatory transfer to bed with CGA. Pt able to return to bed with supervision but significant effort. Pt left in bed at end of session with bed alarm on call bell within reach and needs met.      Therapy  Documentation Precautions:  Precautions Precautions: Fall Required Braces or Orthoses: Splint/Cast Splint/Cast: R post op dressing Restrictions Weight Bearing Restrictions: Yes RUE Weight Bearing: Non weight bearing RLE Weight Bearing: Weight bearing as tolerated LLE Weight Bearing: Weight bearing as tolerated Other Position/Activity Restrictions: NWB through hand, may use platform walker. General:   Vital Signs:  Pain:   Mobility:   Locomotion :    Trunk/Postural Assessment :    Balance:   Exercises:   Other Treatments:      Therapy/Group: Individual Therapy  Lamis Behrmann 03/11/2022, 12:31 PM

## 2022-03-11 NOTE — Progress Notes (Signed)
Patient ID: Joshua Orozco, male   DOB: Feb 05, 1986, 36 y.o.   MRN: [id number] Team Conference Report to Patient/Family  Team Conference discussion was reviewed with the patient and caregiver, including goals, any changes in plan of care and target discharge date.  Patient and caregiver express understanding and are in agreement.  The patient has a target discharge date of 03/22/22.  Sw met with patient and provided team conference updates. Patient pleased with discharge date but concerned of cost of stay. Sw informed patient that any DME or medications needed at discharge will be covered through [facility]. No additional questions or concerns, SW will continue to follow up.   Dyanne Iha 03/11/2022, 2:04 PM
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
## 2022-03-11 NOTE — Progress Notes (Signed)
Pt refuses Miralax and docusate. Pt educated on risk/benefits of medications. Pt asked when he last had a bowel movement. Pt states " today".
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
## 2022-03-11 NOTE — Progress Notes (Signed)
Occupational Therapy Session Note  Patient Details  Name: Joshua Orozco MRN: [id number] Date of Birth: 10/17/1985  Today's Date: 03/11/2022 OT Individual Time: 7124-5809 &1405-1515 OT Individual Time Calculation (min): 55 min & 70 min   Short Term Goals: Week 1:  OT Short Term Goal 1 (Week 1): Pt will complete LB dressing with supervision. OT Short Term Goal 2 (Week 1): Pt will complete ambulatory level toilet transfer using LRAD with supervision OT Short Term Goal 3 (Week 1): Pt will complete tub bench transfer with supervision OT Short Term Goal 4 (Week 1): Pt will complete toileting with supervision.  Skilled Therapeutic Interventions/Progress Updates:  Session 1 Skilled OT intervention completed with focus on tub shower transfers, home management education, ambulatory endurance needed for home management. Pt received upright in bed, report of 4/10 pain in L hip, pre-medicated, with pt denying need of pain reduction intervention however therapist offering rest breaks as needed. Completed bed mobility with supervision using leg lifter, then threaded pants with reacher and donned shoes with LH shoe horn with set up A. Sit > stand using R PFRW with min A then min A needed for donning pants over hips, and CGA ambulatory transfer to w/c in room. Transported dependently in w/c > ADL bathroom for time management. With demonstration provided pt return demonstrated and ambulatory transfer into bathroom with CGA using R PFRW, then sit pivot with min A for managing BLE over tub threshold 2/2 pain despite pt using gait belt to lift legs. Pt still requiring min A for pivoting out, with education provided about extending the knees vs bending at hip for pain management 2/2 fracture location. Education provided on DME available such as tub bench that can be purchased for use at home, suggested use of [facility] for ease of bathing, shower curtain management to prevent water spillage, minimizing stands via lateral leans  for pericare, use of grab bars/installation as well as effective safety strategies for exiting the shower to eliminate falls. Pt stating he doesn't cook at home even at baseline, therefore deferred kitchen management tasks but did issue pt a walker bag for independence and safety with transporting items in home. Education provided about purpose and scenarios that it could be used for to improve safety. In hall, pt ambulated 26 ft with supervision and w/c follow for fatigue. Transported pt dependently rest of way back to room, then pt completed stand pivot with CGA to EOB with supervision A for bed mobility to supine. Pt was left upright in bed, with bed alarm on and all needs in reach at end of session.   Session 2 Skilled OT intervention completed with focus on functional mobility including stairs in order to enter/exit pt's home as well as stepping prep activities. Pt received upright in bed, with nurse present administering pain meds, with 4/10 pain reported with therapist offering rest breaks for pain reduction throughout. Completed bed mobility with supervision using leg lifter, donned shoes with LH shoe horn with mod I. Completed sit > stand and ambulatory transfer using R PFRW with CGA to w/c. Transported pt dependently in w/c <> gym for energy conservation. Pt was able to utilize posterior approach for going up 3 stairs as well as anterior approach for going down 3 stairs using R PFRW at an angle for R elbow WB only and BLE WBAT, with light min A of 1 provided behind pt for lift off and 2nd person at [facility] level for securing walker. Pt with increase of pain after trial however  with prolonged rest break pt was motivated to try again with only 1 assist, with same method for 2 stairs up, 2 stairs down with overall min A for stabilizing RW only. Intermittent rest break provided as pt's pain level went up to 10/10 pain after 2nd trial however pt very excited about his progress and pain level recovered quickly.  With back facing the stairs, pt completed knee lifts/heel taps onto the first step to prepare for stair requirements, with CGA for balance using R PFRW x10. Task upgraded to x8 step ups with alternating feet with pt reporting fatigue. Ambulatory transfer in room with CGA to toilet, with pt continent of B and B with CGA needed for toileting steps. Pt was left seated in recliner, with BLE elevated, all needs in reach at end of session.   Therapy Documentation Precautions:  Precautions Precautions: Fall Required Braces or Orthoses: Splint/Cast Splint/Cast: R post op dressing Restrictions Weight Bearing Restrictions: Yes RUE Weight Bearing: Non weight bearing RLE Weight Bearing: Weight bearing as tolerated LLE Weight Bearing: Weight bearing as tolerated Other Position/Activity Restrictions: NWB through hand, may use platform walker.    Therapy/Group: Individual Therapy  Melvyn Novas, MS, OTR/L  03/11/2022, 7:39 AM

## 2022-03-11 NOTE — Progress Notes (Signed)
Patient is requesting Tramadol to be given only during therapy

## 2022-03-12 ENCOUNTER — Inpatient Hospital Stay (HOSPITAL_COMMUNITY): Payer: Self-pay

## 2022-03-12 IMAGING — DX DG PELVIS 3+V JUDET
3 series · 3 of 3 positions shown · non-contrast
Comparison: [DATE]

CLINICAL DATA: Follow-up exam.

EXAM:
JUDET PELVIS - 3+ VIEW

[x pelvis (1 of 3)]
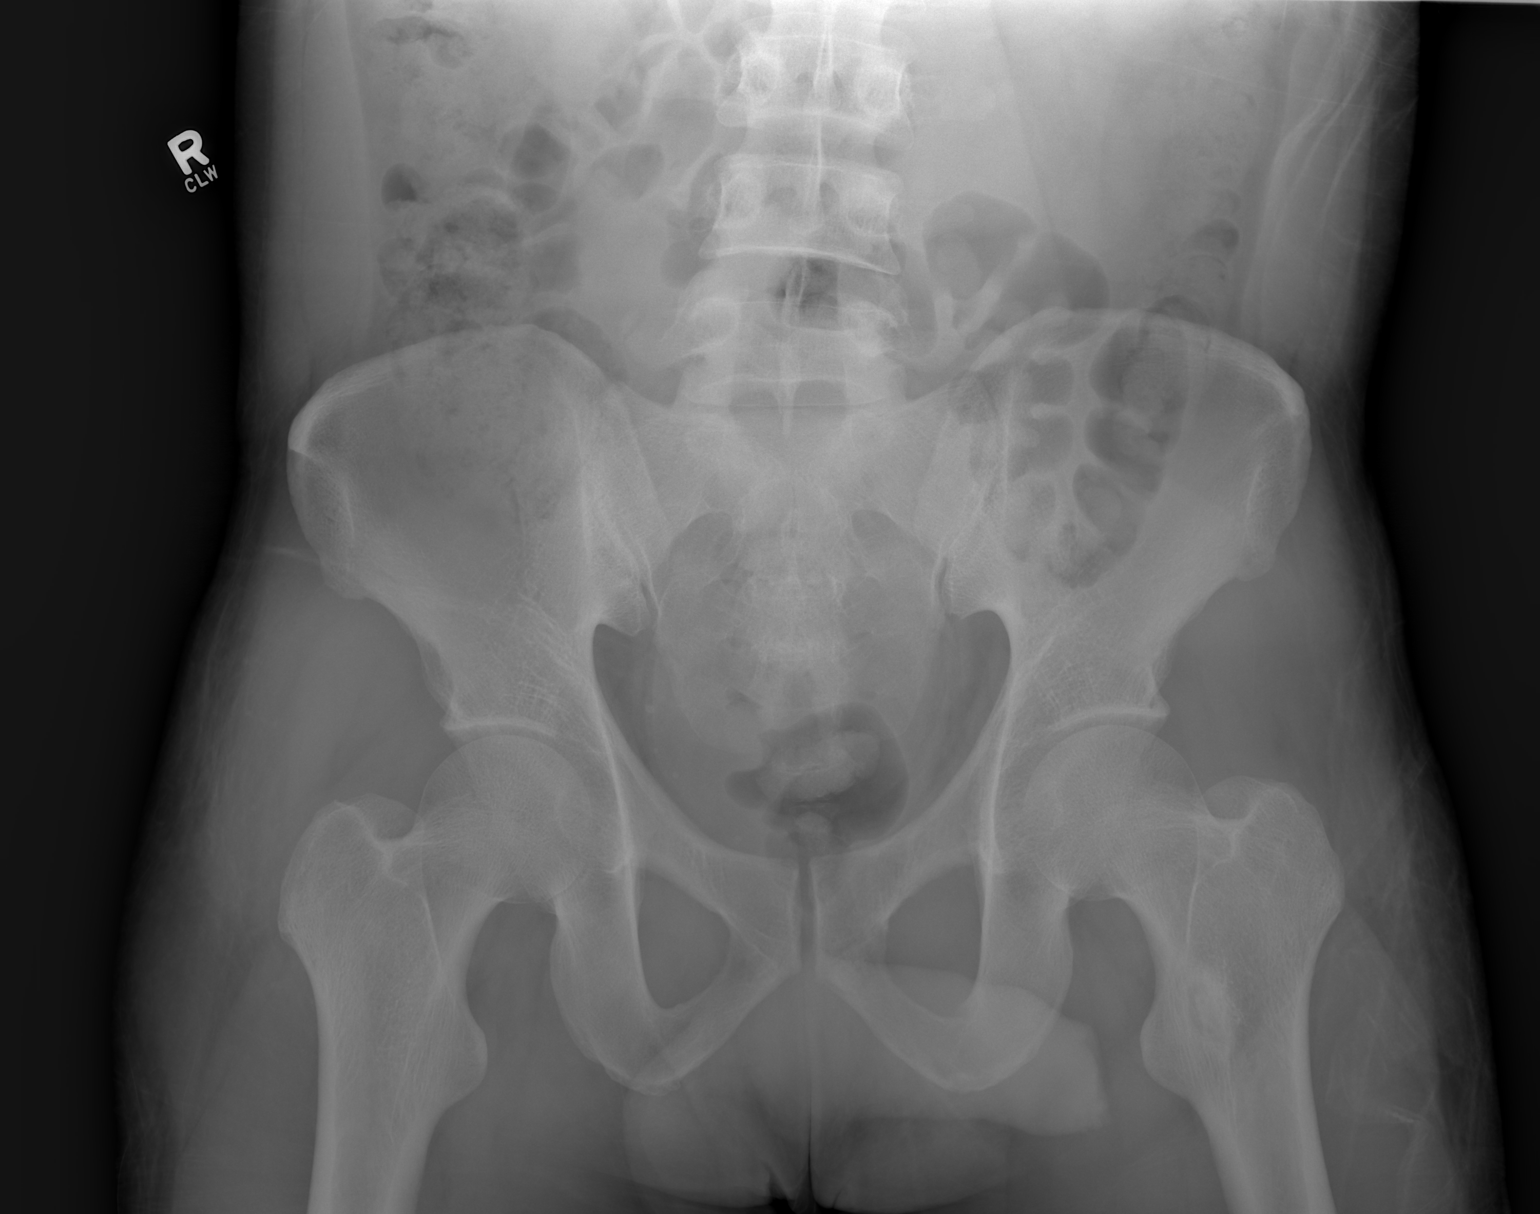

[x pelvis (2 of 3)]
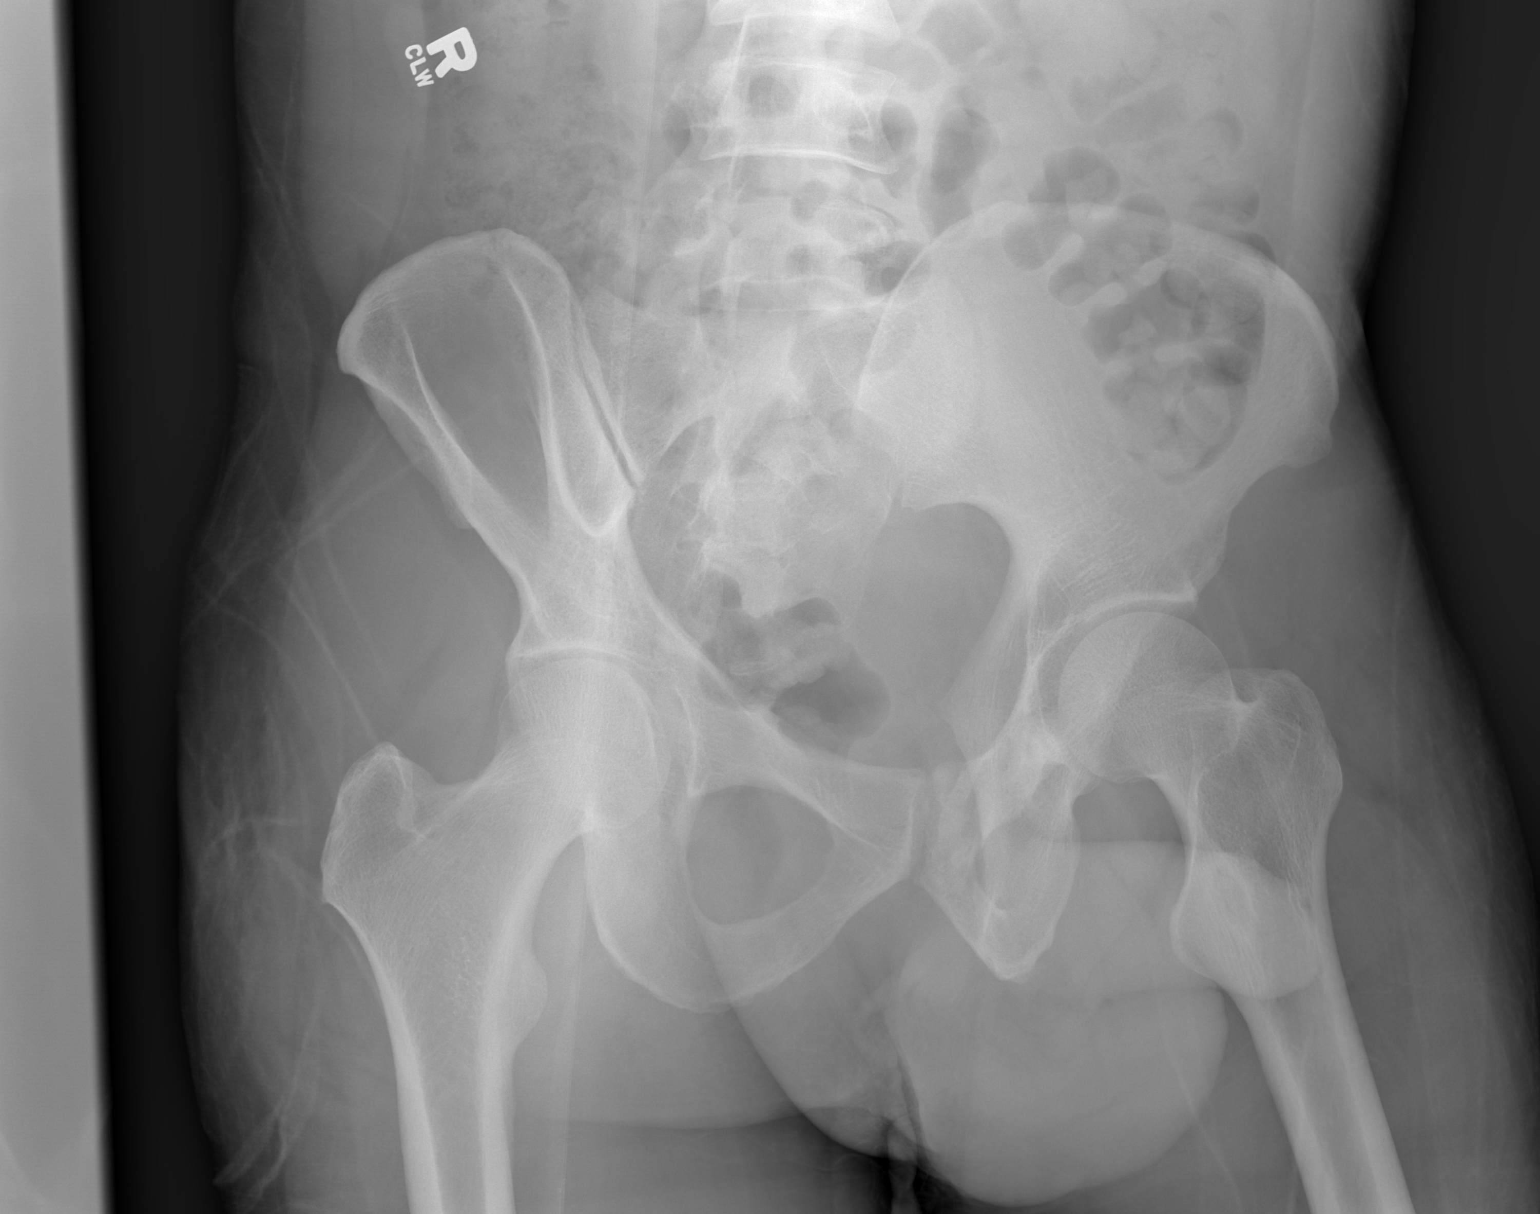

[x pelvis (3 of 3)]
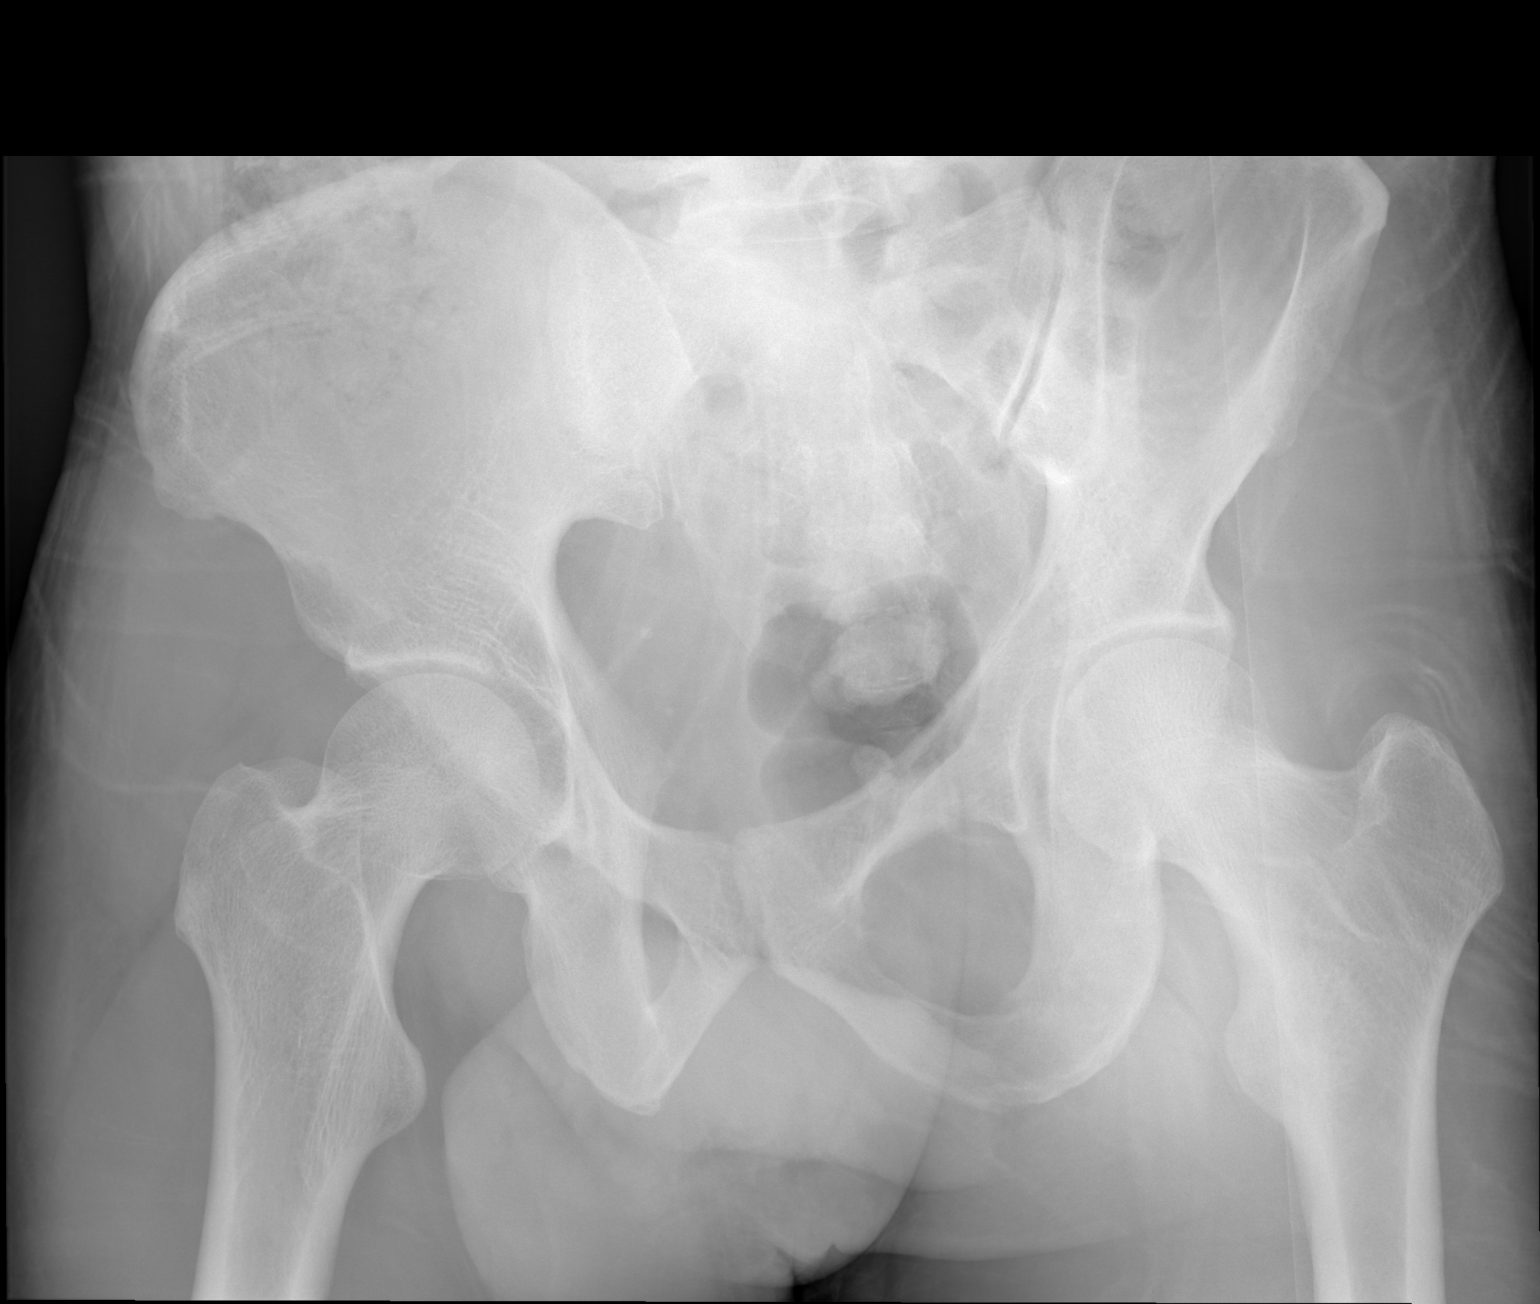

[3 of 3 positions shown; findings below may reference images not displayed]

FINDINGS: Acute fracture deformities are seen involving the left superior and
left inferior pubic rami. No interval callus formation is seen when
compared to the prior exam. There is no evidence of dislocation. A
stable 2.0 cm x 0.9 cm lucency, with thin surrounding sclerotic rim,
is seen along the lesser trochanter of the proximal left femur. Soft
tissue structures are unremarkable.
IMPRESSION: Acute fracture deformities of the left superior and left inferior
pubic rami without interval healing since the prior study.

## 2022-03-12 MED ORDER — METHOCARBAMOL 750 MG PO TABS
750.0000 mg | ORAL_TABLET | Freq: Four times a day (QID) | ORAL | Status: DC
  Administered 2022-03-12 – 2022-03-22 (×38): 750 mg via ORAL
  Filled 2022-03-12 (×38): qty 1

## 2022-03-12 MED ORDER — KETOROLAC TROMETHAMINE 15 MG/ML IJ SOLN
15.0000 mg | Freq: Four times a day (QID) | INTRAMUSCULAR | Status: AC | PRN
  Administered 2022-03-12 – 2022-03-17 (×7): 15 mg via INTRAVENOUS
  Filled 2022-03-12 (×10): qty 1

## 2022-03-12 MED ORDER — LIDOCAINE 5 % EX PTCH
2.0000 | MEDICATED_PATCH | CUTANEOUS | Status: DC
Start: 2022-03-13 — End: 2022-03-22
  Administered 2022-03-13 – 2022-03-22 (×10): 2 via TRANSDERMAL
  Filled 2022-03-12 (×11): qty 2
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
## 2022-03-12 NOTE — Progress Notes (Signed)
Occupational Therapy Session Note  Patient Details  Name: Joshua Orozco MRN: [id number] Date of Birth: 1985/10/07  Today's Date: 03/12/2022 OT Individual Time: 0805-0900 & 1751-0258 OT Individual Time Calculation (min): 55 min & 70 min   Short Term Goals: Week 1:  OT Short Term Goal 1 (Week 1): Pt will complete LB dressing with supervision. OT Short Term Goal 2 (Week 1): Pt will complete ambulatory level toilet transfer using LRAD with supervision OT Short Term Goal 3 (Week 1): Pt will complete tub bench transfer with supervision OT Short Term Goal 4 (Week 1): Pt will complete toileting with supervision.  Skilled Therapeutic Interventions/Progress Updates:  Session 1 Skilled OT intervention completed with focus on ADL retraining, home management education, R hand wound care/hygiene. Pt received upright in bed, with no c/o pain, however nursing in room to provide meds at start of session. Pt requesting to sponge bathe this session as he plans to sponge bathe vs shower at home to minimize the costs of DME. Completed bed mobility with supervision using leg lifter, then CGA sit > stand and ambulatory transfer to w/c in room using R PFRW. Seated in w/c pt completed sponge bathing with set up A, with use of LH sponge. All dressing including shirt, underwear, shorts, socks, shoes all completed with supervision with use of reacher, LH shoe horn at the sit > stand level using R PFRW. Education provided about sponge bathing methods at home, modifications to maximize safety and independence such as using water bottle to fill water in basin for bathing or use of hygiene wipes vs carrying a basin of water. Pt with c/o R hand feeling itchy with therapist removing ACE wrap and guaze with skin breakdown noted from wet guaze. Therapist educated pt on importance of keeping bandages dry for skin integrity. Incision with good presentation, absent from signs of infection or irritation. Waiting on confirmation from surgeon  about protocol for ROM, however therapist rewrapped with guaze and ACE for skin protection. Pt with report of improved feeling. Pt requesting to return to bed at end of session 2/2 fatigue, with pt completing sit > stand and ambulatory transfer to EOB with CGA then supervision bed mobility with leg lifter. Pt was left upright in bed, with bed alarm on and all needs in reach at end of session.  Session 2 Skilled OT intervention completed with focus on fracture/diagnosis education, LUE strengthening, pre-stair training, functional mobility within a staircase context. Pt received upright in bed, initial 7/10 pain in L hip, un-medicated awaiting meds to be ordered from nursing with them to be provided during session. Completed bed mobility with leg lifter, sit > stand and ambulatory transfer to w/c using R PFRW with supervision. Transported pt dependently in w/c <> gym for energy conservation and time management. Pt with questions about why when without pain meds, he can feel a rubbing sensation in his pelvis, with therapist providing visual for increased understanding of fracture locations, sensations that can be experienced and importance of being cautious with movements. Nurse in gym to provide meds, with improvemnet of pain from 7/10 to 3/10 after a few mins.  Stand pivot to EOM, then pt participated on LUE strengthening and therapeutic activity needed for staircase management to get in and out of his home including the following: -With 8 pound dumbbell (2x15)- chest press, bicep curls, overhead press -Then completed tapping cones with alternating feet x10 forwards with CGA, with task upgraded to mass practice x10 with stepping up one 6 inch block  to with PFRW flat on ground with CGA  Transitioned to staircase with 6 inch steps: Pt utilizing posterior approach for going up 3 steps as well as anterior approach for going down 3 steps using R PFRW at an angle for R elbow WB only and BLE WBAT, with initially  min A of 1 only for RW stabilization, however following therapist's adjustments made to the platform piece on the RW, pt was able to complete 2nd trial with same approach at [facility] level. Cues needed throughout for ensuring full foot placement on step prior to lifting PFRW as well as breathing techniques for pain management. Pain did increase after stair trials up to 8/10, therefore discontinued.  Pt was returned to his room where he remained in bed with bed alarm on and all needs in reach at end of session.  Therapy Documentation Precautions:  Precautions Precautions: Fall Required Braces or Orthoses: Splint/Cast Splint/Cast: R post op dressing Restrictions Weight Bearing Restrictions: Yes RUE Weight Bearing: Non weight bearing RLE Weight Bearing: Weight bearing as tolerated LLE Weight Bearing: Weight bearing as tolerated Other Position/Activity Restrictions: NWB through hand, may use platform walker.    Therapy/Group: Individual Therapy  Melvyn Novas, MS, OTR/L  03/12/2022, 7:26 AM

## 2022-03-12 NOTE — Discharge Instructions (Addendum)
Inpatient Rehab Discharge Instructions  Joshua Orozco Discharge date and time: 03/22/22   Activities/Precautions/ Functional Status: Activity: no lifting, driving, or strenuous exercise for till cleared by MD Diet: regular diet   Wound Care:  Functional status:  ___ No restrictions     ___ Walk up steps independently ___ 24/7 supervision/assistance   ___ Walk up steps with assistance ___ Intermittent supervision/assistance  ___ Bathe/dress independently ___ Walk with walker     ___ Bathe/dress with assistance ___ Walk Independently    ___ Shower independently ___ Walk with assistance    ___ Shower with assistance ___ No alcohol     ___ Return to work/school ________  COMMUNITY REFERRALS UPON DISCHARGE:     Medical Equipment/Items Ordered: Platform Rolling [place], [employer]vertising copywriter, [place]                                                 Agency/Supplier: [employer] [phone]   Special Instructions: No weight on right hand or arm. Can weigh bear on right elbow    My questions have been answered and I understand these instructions. I will adhere to these goals and the provided educational materials after my discharge from the hospital.  Patient/Caregiver Signature _______________________________ Date __________  Clinician Signature _______________________________________ Date __________  Please bring this form and your medication list with you to all your follow-up doctor's appointments.

## 2022-03-12 NOTE — Progress Notes (Signed)
Physical Therapy Session Note  Patient Details  Name: Joshua Orozco MRN: [id number] Date of Birth: 10-15-1985  Today's Date: 03/12/2022 PT Individual Time: 1100-1155 PT Individual Time Calculation (min): 55 min   Short Term Goals: Week 1:  PT Short Term Goal 1 (Week 1): Pt will transfer sup <> sit w/ CGA. PT Short Term Goal 2 (Week 1): Pt will transfer sit to stand w/ CGA PT Short Term Goal 3 (Week 1): Pt will amb w/ platform walker and CGA x 70' PT Short Term Goal 4 (Week 1): Pt will assess stairs.  Skilled Therapeutic Interventions/Progress Updates:     Patient in bed upon PT arrival. Patient alert and agreeable to PT session. Patient reported 5-6/10 pelvic pain, elevated to 7-10/10 throughout session with mobility and exercises during session, RN made aware and provided medication during session. PT provided repositioning, rest breaks, and distraction as pain interventions throughout session.   Patient with questions regarding pain management, addressed with RN during session.  Therapeutic Activity: Bed Mobility: Patient performed supine to/from sit with supervision-mod I in a flat bed with min use of bed rails.  Transfers: Patient performed stand pivot bed>w/c and w/c<>mat table and sit to/from stand x2 with CGA using R PFRW. Provided verbal cues for pushing up and reaching back to sit and forward weight shift.  Gait Training:  Patient ambulated 50 feet and 24 feet using R PFRW with close supervision. Ambulated with decreased gait speed, decreased step length and height, increased B knee flexion, forward trunk lean, and downward head gaze. Provided verbal cues for erect posture, increased step height, and looking ahead.  Therapeutic Exercise: Patient performed the following exercises with verbal and tactile cues for proper technique. -B heel slides 2x5 -B glut sets 2x10 with 5 sec hold -B hip abd/add 2x5 -B quad sets 2x10 with 5 sec hold -seated B LAQ 2x5  Patient in bed with ice  packs provided for pain management at end of session with breaks locked, bed alarm set, and all needs within reach.   Therapy Documentation Precautions:  Precautions Precautions: Fall Required Braces or Orthoses: Splint/Cast Splint/Cast: R post op dressing Restrictions Weight Bearing Restrictions: Yes RUE Weight Bearing: Non weight bearing RLE Weight Bearing: Weight bearing as tolerated LLE Weight Bearing: Weight bearing as tolerated Other Position/Activity Restrictions: NWB through hand, may use platform walker.    Therapy/Group: Individual Therapy  Joshua Orozco Joshua Orozco PT, DPT  03/12/2022, 12:26 PM

## 2022-03-13 NOTE — Progress Notes (Signed)
Occupational Therapy Session Note  Patient Details  Name: Joshua Orozco MRN: [id number] Date of Birth: 1986/04/13  Today's Date: 03/13/2022 OT Individual Time: 1102-1201 & 1420-1531 OT Individual Time Calculation (min): 59 min  & 71 min   Short Term Goals: Week 1:  OT Short Term Goal 1 (Week 1): Pt will complete LB dressing with supervision. OT Short Term Goal 2 (Week 1): Pt will complete ambulatory level toilet transfer using LRAD with supervision OT Short Term Goal 3 (Week 1): Pt will complete tub bench transfer with supervision OT Short Term Goal 4 (Week 1): Pt will complete toileting with supervision.  Skilled Therapeutic Interventions/Progress Updates:  Session 1 Skilled OT intervention completed with focus on A/AAROM of R wrist/digits per surgeon order with protocol on exercises progression. Pt received upright in bed, agreeable to session, no c/o pain reporting he is trying pain patches with improvement noted. Completed bed mobility with mod I using leg lifter and helicopter method. Donned shoes with mod I using LH shoe horn. All transfers at the supervision level using R PFRW for sit > stands and ambulatory levels. Ambulated about 35 ft on the way to the gym however with fatigue, therefore transported dependently in w/c rest of way. Seated at [facility], with table, pt participated in the following A/AAROM exercises to promote functional use of the dominant hand for ADL tasks:  (Therapist unwrapped gauze and ACE wrap) x20 reps Table top finger abd/adduction Thumb abduction Wrist radial/ulnar deviation Table top single finger extension Forearm pro/supination AROM wrist extension/flexion Thumb opposition MP flexion AROM Composite AROM flexion Tendon glides Table tope knuckle lifts  Multimodal cues needed for form and positioning throughout. Issued pt an HEP for following exercises at home, however would benefit from further practice. Therapist provided education that pt doesn't have to  wrap the R hand only at the incision site, however pt preferring at least one layer therefore therapist donned gauze layer for comfort. Discussed importance of AROM at this stage for restoration of mobility with use of the hand. Transported pt dependently in w/c > room, with pt left seated in w/c with nursing in room at direct care handoff.  Session 2 Skilled OT intervention completed with focus on R hand AROM/light strengthening. Pt received upright in bed, agreeable to session, 7/10 pain, with therapist notifying nurse of pain med needs, and nurse applied shortly after during session. Completed bed mobility with mod I using leg lifter and helicopter method. Donned shoes with mod I using LH shoe horn. All transfers at the supervision level using R PFRW for sit > stands and ambulatory levels. Ambulated about 60 ft on the way to the gym however with fatigue, therefore transported dependently in w/c rest of way. Seated at high low table, pt participated in the following A/AAROM exercises to promote functional use of the dominant hand for ADL tasks:  AROM measurements:  110 degrees wrist flexion 50 degrees wrist extension MP digits- 80 degrees (2nd digit), 80 (3rd digit), 65 (4th digit), 55 (5th digit) Ulnar deviation- 30 degrees Radial deviation- 15 degrees  Theraputty (tan/light) (AROM focus) -Rolling into long roll for finger extension -Using 4th and 5 digits for light pinching along the roll, for finger flexion in prep for composite flexion -Twisting roll into a roll  9 hole peg test (fine motor coordination and AROM focus) -R hand: 46.32 sec -L hand: 30.39 sec  Pegboard with complex design (AROM of R digits and standing tolerance without AD focus) -placing/removing pegs with R hand only -  CGA to supervision needed for standing balance without UE support  Pt ambulated from gym to room over 70 ft. Increased fatigue and pain after session with therapist offering ice and applied to L thigh for  pain reduction. Pt was left semi-supine in bed with bed alarm on and all needs in reach at end of session.  Therapy Documentation Precautions:  Precautions Precautions: Fall Required Braces or Orthoses: Splint/Cast Splint/Cast: R post op dressing Restrictions Weight Bearing Restrictions: Yes RUE Weight Bearing: Non weight bearing RLE Weight Bearing: Weight bearing as tolerated LLE Weight Bearing: Weight bearing as tolerated Other Position/Activity Restrictions: NWB through hand, may use platform walker.    Therapy/Group: Individual Therapy  Melvyn Novas, MS, OTR/L  03/13/2022, 7:45 AM
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
## 2022-03-16 LAB — CBC
HCT: 35.6 % — ABNORMAL LOW (ref 39.0–52.0)
Hemoglobin: 11.6 g/dL — ABNORMAL LOW (ref 13.0–17.0)
MCH: 29.5 pg (ref 26.0–34.0)
MCHC: 32.6 g/dL (ref 30.0–36.0)
MCV: 90.6 fL (ref 80.0–100.0)
Platelets: 288 10*3/uL (ref 150–400)
RBC: 3.93 MIL/uL — ABNORMAL LOW (ref 4.22–5.81)
RDW: 12.6 % (ref 11.5–15.5)
WBC: 7.5 10*3/uL (ref 4.0–10.5)
nRBC: 0 % (ref 0.0–0.2)

## 2022-03-16 LAB — BASIC METABOLIC PANEL
Anion gap: 8 (ref 5–15)
BUN: 19 mg/dL (ref 6–20)
CO2: 26 mmol/L (ref 22–32)
Calcium: 9.2 mg/dL (ref 8.9–10.3)
Chloride: 103 mmol/L (ref 98–111)
Creatinine, Ser: 1.14 mg/dL (ref 0.61–1.24)
GFR, Estimated: >60 mL/min (ref >60)
Glucose, Bld: 91 mg/dL (ref 70–99)
Potassium: 3.8 mmol/L (ref 3.5–5.1)
Sodium: 137 mmol/L (ref 135–145)
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
## 2022-03-17 ENCOUNTER — Inpatient Hospital Stay (HOSPITAL_COMMUNITY): Payer: Self-pay

## 2022-03-17 MED ORDER — IOHEXOL 9 MG/ML PO SOLN
ORAL | Status: AC
  Administered 2022-03-17: 500 mL
  Filled 2022-03-17: qty 1000

## 2022-03-17 NOTE — Progress Notes (Signed)
Physical Therapy Session Note  Patient Details  Name: Joshua Orozco MRN: [id number] Date of Birth: 1986-01-10  Today's Date: 03/17/2022 PT Individual Time: 0800-0900; 1445-1530 PT Individual Time Calculation (min): 60 min and 45 min  Short Term Goals: Week 2:  PT Short Term Goal 1 (Week 2): =LTG due to ELOS  Skilled Therapeutic Interventions/Progress Updates:    Session 1: Pt received supine in bed, agreeable to PT session. Pt reports 2/10 pain in L hip/pelvic region at rest. Pt premedicated for pain prior to start of therapy session. Pt does have increase in pain with functional mobility that improves at rest. Supine to sit with Supervision. Sit to stand and transfer with R PFRW and CGA during session. Ascend/descend 3 x 6" stairs backwards ascent and forwards descent with R PFRW with assist x 2 (1-2 for management of PFRW and CGA to min A for balance). Pt reports decreased pain and increased independence with stair navigation this date. Ambulation 2 x 20 ft, 1 x 40 ft with QC and mod A and RUE forearm support initially, progresses to min A with just QC to simulate bathroom navigation. Pt returned to bed at end of session, Supervision for bed mobility with use of leg lifter. Pt left seated in bed with needs in reach, bed alarm in place. Utilized interpreter during session.  Session 2: Pt received seated in w/c in room, agreeable to PT session. Pt reports 5/10 pain in his L hip/pelvic region at rest, reports being premedicated prior to start of therapy session. Ambulation 3 x 20 ft with LBQC and min A with focus on narrower BOS and keeping QC in front of LLE due to narrow space that pt must navigate in his bathroom at home. Pt does well maintaining balance and performing gait sequencing with use of LBQC in this manner. Again encouraged use of R PFRW for all other mobility in order to unweight LLE. Ambulation x 150 ft with R PFRW and CGA for balance. Per pt report his bed at home is very low to the  ground. Sit to stand with mod A to R PFRW from low, pliable surface of couch in therapy apartment in order to simulate his bed at home. Per pt report he can place a sturdy chair with arms next to his bed at home. Sit to stand from low, pliable surface of couch with use of nightstand for R forearm support during transfer with min A. Pt exhibits decreased assist needed to stand from lower surface with lower R forearm support during transfer. Pt returned to bed at end of session, Supervision with use of leg lifter. Pt left seated in bed with needs in reach, bed alarm in place. Utilized interpreter during session.  Therapy Documentation Precautions:  Precautions Precautions: Fall Required Braces or Orthoses: Splint/Cast Splint/Cast: R post op dressing Restrictions Weight Bearing Restrictions: Yes RUE Weight Bearing: Non weight bearing RLE Weight Bearing: Weight bearing as tolerated LLE Weight Bearing: Weight bearing as tolerated Other Position/Activity Restrictions: NWB through hand, may use platform walker.       Therapy/Group: Individual Therapy   Peter Congo, PT, DPT, CSRS 03/17/2022, 3:50 PM
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
## 2022-03-18 MED ORDER — OXYCODONE HCL 5 MG PO TABS: 10.0000 mg | ORAL_TABLET | ORAL | Status: DC | PRN

## 2022-03-18 MED ORDER — OXYCODONE HCL 5 MG PO TABS
10.0000 mg | ORAL_TABLET | ORAL | Status: DC | PRN
  Administered 2022-03-20: 10 mg via ORAL
  Filled 2022-03-18: qty 2

## 2022-03-18 MED ORDER — KETOROLAC TROMETHAMINE 15 MG/ML IJ SOLN
15.0000 mg | Freq: Four times a day (QID) | INTRAMUSCULAR | Status: DC | PRN
Start: 2022-03-18 — End: 2022-03-19
  Administered 2022-03-18 – 2022-03-19 (×2): 15 mg via INTRAVENOUS
  Filled 2022-03-18 (×3): qty 1
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
## 2022-03-18 NOTE — Progress Notes (Signed)
Physical Therapy Session Note  Patient Details  Name: Joshua Orozco MRN: [id number] Date of Birth: 1985/09/26  Today's Date: 03/18/2022 PT Individual Time: 1100-1200; 1500-1540 PT Individual Time Calculation (min): 60 min and 40 min  Short Term Goals: Week 2:  PT Short Term Goal 1 (Week 2): =LTG due to ELOS  Skilled Therapeutic Interventions/Progress Updates:    Session 1: Pt received supine in bed asleep, arousable and agreeable to PT session. Pt reports 7/10 pain at rest in L hip/pelvic region. Pt requests pain medication from nursing at beginning of session, however he is frustrated by inability to receive IV pain medication at this time. Pt reports he does not feel that oral pain medication is controlling his pain. Nursing to consult with MD regarding pain medication but also educated pt that he will not be able to receive IV pain medication when he goes home. Pt understanding of this and reports he only plans on moving when he needs to use the bathroom. Educated pt on importance of continuing mobility at home even with pain to continue to progress with his recovery. Pt understanding of education. IV pain medication becomes available during session but pt declines to take it until prior to PM therapy sessions. Deferred attempting stairs and curb this session 2/2 pain. Sit to stand and transfers with PFRW and Supervision during session. Ambulation x 180 ft with R PFRW and Supervision, onset of antalgic gait pattern and increase in pain to 8/10 with gait. Assisted pt with starting a load of laundry, he is able to load washer from seated position in w/c and able to direct therapist to preferred settings. Standing LLE therex with use of R PFRW and CGA for balance performing hip flex, hip abd, and HS curls x 15 reps each. Reviewed pt's xrays of pelvic fracture and answered questions regarding his injury. Pt appreciative of education. Pt returned to bed at end of session at Supervision level with use of leg  lifter. Pt left supine in bed with needs in reach at end of session. Utilized interpreter during session.  Session 2: Pt received seated in w/c in room, agreeable to PT session. Pt reports 7/10 pain in L hip/pelvic region at rest but declines any intervention prior to start of therapy session as he wishes to see how he does navigating stairs without pain medication. Sit to stand and transfer with PFRW and CGA throughout session. Ascend/descend 3 x 6" stairs with PFRW and mod A for balance and/or PFRW management. Pt exhibits decreased assist needed with stair navigation this date but ongoing significant pain with activity. Sit to stand x 3 reps from low, pliable surface of couch with use of arm of chair and PFRW to stand. Pt able to complete transfer similar to home setup with CGA but does need to use his chin on PF of RW in order to achieve stance. Pt reports whole body pain rated as "grande" by end of session. Pt able to receive pain medication via IV from nursing at end of session. Assisted pt with retrieving his laundry from the dryer. Pt returned to bed with PFRW and CGA. Sit to supine Supervision with use of leg lifter. Pt left supine in bed with needs in reach, bed alarm in place. Utilized interpreter during session.  Therapy Documentation Precautions:  Precautions Precautions: Fall Required Braces or Orthoses: Splint/Cast Splint/Cast: R post op dressing Restrictions Weight Bearing Restrictions: Yes RUE Weight Bearing: Non weight bearing RLE Weight Bearing: Weight bearing as tolerated LLE Weight Bearing:  Weight bearing as tolerated Other Position/Activity Restrictions: NWB through hand, may use platform walker.       Therapy/Group: Individual Therapy   Peter Congo, PT, DPT, CSRS 03/18/2022, 12:48 PM

## 2022-03-18 NOTE — Progress Notes (Signed)
Patient ID: Joshua Orozco, male   DOB: 1985/10/31, 36 y.o.   MRN: [id number]  Team Conference Report to Patient/Family  Team Conference discussion was reviewed with the patient and caregiver, including goals, any changes in plan of care and target discharge date.  Patient and caregiver express understanding and are in agreement.  The patient has a target discharge date of 03/22/22.  Sw met with patient and provided team conference updates. Patient discussed his discharge plan of staying with his boos/friend until able to return to work. Patient reports that he is unable to discharge with a TTB or BSC due to spacing and preference of the person he is staying with. No additional questions or concerns. Patient shares that his friend will pick him up for discharge. Patient informed DME and medications are ordered through [facility].  Dyanne Iha 03/18/2022, 1:48 PM

## 2022-03-18 NOTE — Patient Care Conference (Signed)
Inpatient RehabilitationTeam Conference and Plan of Care Update Date: 03/18/2022   Time: 11:50 AM    Patient Name: Joshua Orozco      Medical Record Number: [id number]  Date of Birth: 1986-09-21 Sex: Male         Room/Bed: 1L24M/0N02V-25 Payor Info: Payor: MED PAY / Plan: MED PAY ASSURANCE / Product Type: *No Product type* /    Admit Date/Time:  03/07/2022  2:23 PM  Primary Diagnosis:  Trauma  Hospital Problems: Principal Problem:   Trauma Active Problems:   Closed displaced fracture of fourth metacarpal bone of right hand with routine healing    Expected Discharge Date: Expected Discharge Date: 03/22/22  Team Members Present: Physician leading conference: Dr. Sula Soda Social Worker Present: Lavera Guise, BSW Nurse Present: Kennyth Arnold, RN PT Present: Casimiro Needle, PT OT Present: Candee Furbish, OT PPS Coordinator present : Edson Snowball, PT     Current Status/Progress Goal Weekly Team Focus  Bowel/Bladder   Continent of b/b  Maintain continence  Assess and Assit with toileting needs   Swallow/Nutrition/ Hydration             ADL's   Set up A shower, Supervision LB dressing without AE now, CGA to supervision toileting, and supervision ambulatory toilet transfers with the R PFRW. Main focus in our sessions has been bathroom access and mobility with OT progressing pt to use of quad cane for ambulatory transfers for up to 24 ft with mod A to enable bathroom transfers at d/c otherwise pt is unable/prohibited from having BSC in his room and won't be able to toilet..  mod I  bathroom management, quad cane mobility for bathroom transfers, RUE AROM and light strengthening   Mobility   bed mobility S using leg lifter, CGA transfers with PFRW, CGA to min A gait x 150 ft with PFRW, +2 for PFRW management on stairs (backwards), min A short distance gait with LBQC for bathroom access (very painful)  supervision  pain management, functional mobility/transfers, problem solving  stair navigation and navigation in/out of bathroom   Communication             Safety/Cognition/ Behavioral Observations            Pain   Pt taking PRN oxycodone and scheduled tylenol  Maintaining a Pain score < 3  Assess pain qs   Skin   Skin intact, RUE sutures in hand  Maintain skin intergrity  Assess qs     Discharge Planning:  Discharging home with no support. Patient uninsured   Team Discussion: Ambulated 150 ft yesterday, discontinued Lovenox. Abdominal CT normal. Hematoma resolved. Bradycardic/hypotensive, asymptomatic. Continue Toradol IV until discharge, then transition to oral pain medications. Continent B/B, continues to report pelvic pain. Right arm incision with sutures, will consult surgeon for removal date. Current landlord has restrictions as to equipment allow in room. Will not allow BSC, unlikely to allow tub transfer bench. Platform walker will not fit through bathroom door. Has 4 steps to enter home. Therapy working to problem solve. Will provide HEP.  Patient on target to meet rehab goals: yes, mod I to supervision goals. Currently set-up assist dressing, bathing. CGA with platform walker, +2 to manage stairs. Have been practicing use of quad cane.   *See Care Plan and progress notes for long and short-term goals.   Revisions to Treatment Plan:  Adjusting medications   Teaching Needs: Family education, medication/pain management, skin/wound care, transfer/gait training, etc.   Current Barriers to Discharge: Inaccessible home  environment, Decreased caregiver support, Home enviroment access/layout, Wound care, Lack of/limited family support, Insurance for SNF coverage, and Weight bearing restrictions  Possible Resolutions to Barriers: Family education Provide HEP Order recommended DME     Medical Summary Current Status: pain, pelvic hematoma resolved, chronic abdominal pain  Barriers to Discharge: Medical stability  Barriers to Discharge Comments: pain,  bradycardia, hypotension, chronic abdominal pain Possible Resolutions to [employer] Focus: XRs obtained and discussed with patient, cotninue IV Ketorolac prn while in hospital, decrease oxycodone to 10mg  q4H prn for severe pain, continue to monitor BP and HR TID, CT ordered of abdomen and pelvis and discussed abdomen is normal and pelvic hematoma has resolved   Continued Need for Acute Rehabilitation Level of Care: The patient requires daily medical management by a physician with specialized training in physical medicine and rehabilitation for the following reasons: Direction of a multidisciplinary physical rehabilitation program to maximize functional independence : Yes Medical management of patient stability for increased activity during participation in an intensive rehabilitation regime.: Yes Analysis of laboratory values and/or radiology reports with any subsequent need for medication adjustment and/or medical intervention. : Yes   I attest that I was present, lead the team conference, and concur with the assessment and plan of the team.   03/18/2022, 1:24 PM

## 2022-03-18 NOTE — Progress Notes (Signed)
Occupational Therapy Session Note  Patient Details  Name: Joshua Orozco MRN: [id number] Date of Birth: 1985/12/19  Today's Date: 03/18/2022 OT Individual Time: 0802-0858 & 1420-1500 OT Individual Time Calculation (min): 56 min & 40 min   Short Term Goals: Week 2:  OT Short Term Goal 1 (Week 2): STG = LTG 2/2 ELOS  Skilled Therapeutic Interventions/Progress Updates:  Session 1 Skilled OT intervention completed with focus on ADL retraining, functional ambulatory endurance. Pt received upright in bed, interpreter present, with pt reporting 4/10 pain in L hip. Pt had not received IV meds, however agreeable to continue with session to approach pt's pain management and functional mobility without meds that he won't be able to receive at d/c. Of note- pt's pain was moderated quite well with self-care and functional ambulation of 6/10 or less and recovered well with rest breaks.  Bed mobility with mod I using leg lifter, to EOB, sit > stand using quad cane with CGA then min A ambulatory transfer to w/c. Seated at sink, pt completed sponge bathing with set up/supervision assist for all bathing/dressing at the sit > stand level as pt will only be able to sponge bathe at d/c 2/2 home bathroom restrictions. Education provided about clearance for using his R hand to donn pants over hips, however reminded pt of difference of AROM and NWB restrictions. Discussed TTB as option at home, however ultimately decided that his "land lady" wouldn't allow it.   To promote functional mobility needed for bathroom transfers at home, pt ambulated 21 ft using quad cane on L side, with initial min A fading to CGA. With rest break, pt was able to repeat trial with CGA and same technique however 39 ft including one wide turn in hallway. Transported pt dependently in w/c > room, then returned to bed with same assist and mod I for EOB > upright in bed with leg lifter. Pt remained upright in bed, with bed alarm on and all needs in  reach at end of session.  Session 2 Skilled OT intervention completed with focus on laundry management, dynamic balance, pain management education, R hand strengthening. Pt received upright in bed, interpreter present throughout with pt reporting 6/10 pain in L hip. Education provided about managing pain without IV meds that pt will not be able to receive upon going home, with encouragement to see if he can attempt session/stairs with PT with only the oral meds as pt will be required to manage ambulation/stairs at home in same manner. Agreeable to try session without however informed pt that if pain was too high we could ask for meds.   Completed bed mobility with mod I using leg lifter, sit > stand using R PFRW with supervision, then ambulatory transfer with supervision to w/c. Transported dependently in w/c <> room for energy conservation. Pt reports his laundry room is wide open with limited barriers, therefore able to use his PFRW to maximize independence with IADLs. Sit > stand and ambulatory transfer to washer/dryer, then utilized reacher to transfer clothing to dryer with supervision for balance. Education provided about wet clothes being heavier than dry so to transfer 1 item at a time for safety, as well as more efficient RW positioning in between the machines for safety with reaching and leaning outside BOS. Pt with request to return to room for toileting.   Ambulatory transfer using R PFRW into bathroom with supervision, with pt continent of bowel and bladder, then practically mod I for toileting as pt completed pericare and  donning of pants prior to calling therapist in for assist this session.  Seated in w/c, pt able to utilize 2-5 digits to pinch and place/remove yellow, red, green and blue clips from horizontal bar, to promote light strengthening of his dominant side needed for ADL tasks. Pt with no increase in pain, tolerating all levels of resistance well, however weakness noted in 4th  digit and increased assist from other fingers needed for higher level clips. Pt remained seated in w/c, with all needs in reach at end of session, awaiting next PT.   Therapy Documentation Precautions:  Precautions Precautions: Fall Required Braces or Orthoses: Splint/Cast Splint/Cast: R post op dressing Restrictions Weight Bearing Restrictions: Yes RUE Weight Bearing: Non weight bearing RLE Weight Bearing: Weight bearing as tolerated LLE Weight Bearing: Weight bearing as tolerated Other Position/Activity Restrictions: NWB through hand, may use platform walker.    Therapy/Group: Individual Therapy  Melvyn Novas, MS, OTR/L  03/18/2022, 7:29 AM

## 2022-03-19 ENCOUNTER — Other Ambulatory Visit (HOSPITAL_COMMUNITY): Payer: Self-pay

## 2022-03-19 MED ORDER — METHOCARBAMOL 750 MG PO TABS
750.0000 mg | ORAL_TABLET | Freq: Four times a day (QID) | ORAL | 0 refills | Status: AC
  Filled 2022-03-19: qty 90, 23d supply, fill #0

## 2022-03-19 MED ORDER — MELOXICAM 7.5 MG PO TABS
7.5000 mg | ORAL_TABLET | Freq: Two times a day (BID) | ORAL | 0 refills | Status: AC | PRN
  Filled 2022-03-19: qty 30, 15d supply, fill #0

## 2022-03-19 MED ORDER — ACETAMINOPHEN 325 MG PO TABS: 325.0000 mg | ORAL_TABLET | ORAL | Status: AC | PRN

## 2022-03-19 MED ORDER — OXYCODONE HCL 10 MG PO TABS
10.0000 mg | ORAL_TABLET | ORAL | 0 refills | Status: AC | PRN
  Filled 2022-03-19: qty 30, 5d supply, fill #0

## 2022-03-19 MED ORDER — LIDOCAINE 5 % EX PTCH
2.0000 | MEDICATED_PATCH | CUTANEOUS | Status: DC
Start: 2022-03-19 — End: 2022-03-19

## 2022-03-19 MED ORDER — FOLIC ACID 1 MG PO TABS
1.0000 mg | ORAL_TABLET | Freq: Every day | ORAL | 0 refills | Status: AC
  Filled 2022-03-19: qty 30, 30d supply, fill #0

## 2022-03-19 MED ORDER — ADULT MULTIVITAMIN W/MINERALS CH: 1.0000 | ORAL_TABLET | Freq: Every day | ORAL | Status: AC

## 2022-03-19 MED ORDER — LIDOCAINE 5 % EX PTCH
2.0000 | MEDICATED_PATCH | CUTANEOUS | 0 refills | Status: AC
  Filled 2022-03-19: qty 30, 15d supply, fill #0

## 2022-03-19 MED ORDER — ASCORBIC ACID 1000 MG PO TABS
1000.0000 mg | ORAL_TABLET | Freq: Every day | ORAL | 0 refills | Status: AC
  Filled 2022-03-19: qty 30, 30d supply, fill #0

## 2022-03-19 MED ORDER — MELOXICAM 7.5 MG PO TABS: 7.5000 mg | ORAL_TABLET | Freq: Two times a day (BID) | ORAL | Status: DC | PRN

## 2022-03-19 NOTE — Progress Notes (Signed)
Occupational Therapy Session Note  Patient Details  Name: Joshua Orozco MRN: [id number] Date of Birth: 01-Jul-1986  Today's Date: 03/19/2022 OT Individual Time: 4235-3614 & 4315-4008 OT Individual Time Calculation (min): 56 min  & 10 min Missed time: 65 mins Missed time reason: fatigue/ill   Short Term Goals: Week 2:  OT Short Term Goal 1 (Week 2): STG = LTG 2/2 ELOS  Skilled Therapeutic Interventions/Progress Updates:  Session 1 Skilled OT intervention completed with focus on pain management, d/c planning, self-care, light strengthening/AROM of R hand. Pt received upright in bed c/o full body aches, stomach pain, increased L hip pain, stating "I feel like I have the flu." Pt also reported and per PT yesterday, pt attempted stairs without IV meds to prep for home requirements with mobility 2/2 inability to use IV meds at home, and almost passed out from the pain. Therapist alerted nursing/MD about change in pt status, as well as request of setting a plan in place for pain management upon d/c to allow pt to mobilize within his home, with care team present at pt bedside. Therapist engaged in discussion regarding plan for pain management as well as education provided to pt about IADLs that might require him to go up/down the stairs vs "staying inside until I heal" as pt believes, I.e. taking out trash, getting food, mail, fresh air, with complete healing potentially being months down the road and therapist encouraging pt to refrain from staying inside for months on end. Plan put in place for change of oral meds that are better for gastric lining as well as more efficient pain control.  Completed bed mobility with mod I using leg lifter then sit > stand with min A using quad cane and min-mod A stand pivot to w/c with quad cane, which is much different than pt's performance even yesterday. Pt expressing he can feel "all my bones shifting." Seated at sink, pt completed grooming tasks with mod I. Initially  planned to go to gym, however pt requesting to return to bed 2/2 pain and overall not feeling well. Completed sit > stand with mod A using quad cane and mod A stand pivot to EOB. Mod I bed mobility with leg lifter. To promote AROM/strength of pt's dominant hand, pt completed un-reviewed exercises from HEP with use of tan putty, including the following: -Putty squeezes -rolling into tube, then pinching with 4th and 5th digits along the tube -MP flexion squeezes -Finger extension putty spreads (All x10 reps)  When pt rotated his forearm, pt reported feeling rubbing sensation between his bones of his hands. Education provided about anatomical occurrence of the  radius/ulnar bones with forearm rotation and that some "rubbing" is normal as healing tissue continues to heal and form around the fractures. Pt remained upright in bed, with bed alarm on, ice applied to L hip for pain reduction and all needs in reach at end of session.  Session 2 Pt received semi-supine in bed, asleep, easily woken. C/o full body pain with aches/chills and congestion, with team aware. Offered pt a shower for pain reduction and warmth however pt stating he feels too weak to at this time. Offered bed level AROM and strengthening exercises of the R hand however pt politely requesting to sleep at this time. Therapist retrieved warm blanket and fresh drinks for pt per request. Pt was left upright in bed, with bed alarm on and all needs in reach at end of session. Pt missed 65 mins of OT intervention 2/2 fatigue/illness. Will  attempt to revisit pt as time allows.   Therapy Documentation Precautions:  Precautions Precautions: Fall Required Braces or Orthoses: Splint/Cast Splint/Cast: R post op dressing Restrictions Weight Bearing Restrictions: Yes RUE Weight Bearing: Non weight bearing RLE Weight Bearing: Weight bearing as tolerated LLE Weight Bearing: Weight bearing as tolerated Other Position/Activity Restrictions: NWB  through hand, may use platform walker.    Therapy/Group: Individual Therapy  Melvyn Novas, MS, OTR/L  03/19/2022, 7:43 AM
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
## 2022-03-19 NOTE — Progress Notes (Signed)
Physical Therapy Session Note  Patient Details  Name: Joshua Orozco MRN: [id number] Date of Birth: [birth year]  Today's Date: 03/19/2022 PT Individual Time: 1101-1135 PT Individual Time Calculation (min): 34 min  Today's Date: 03/19/2022 PT Missed Time: 26 Minutes Missed Time Reason: Patient ill (Comment);Patient fatigue  Short Term Goals: Week 1:  PT Short Term Goal 1 (Week 1): Pt will transfer sup <> sit w/ CGA. PT Short Term Goal 1 - Progress (Week 1): Met PT Short Term Goal 2 (Week 1): Pt will transfer sit to stand w/ CGA PT Short Term Goal 2 - Progress (Week 1): Met PT Short Term Goal 3 (Week 1): Pt will amb w/ platform walker and CGA x 70' PT Short Term Goal 3 - Progress (Week 1): Met PT Short Term Goal 4 (Week 1): Pt will assess stairs. PT Short Term Goal 4 - Progress (Week 1): Met Week 2:  PT Short Term Goal 1 (Week 2): =LTG due to ELOS  Skilled Therapeutic Interventions/Progress Updates:   Received pt semi-reclined in bed, pt agreeable to PT treatment, and reported feeling "sick" stating at all of his joints hurt and yesterday he had diarrhea - per MD pt may have GI virus. Interpreter present during session. Session with emphasis on functional mobility/transfers, generalized strengthening and endurance, and ambulation. Pt transferred semi-reclined<>sitting EOB with HOB elevated and use of bedrails with supervision and donned shoes sitting EOB with supervision. Pt transferred bed<>WC stand<>pivot with quad cane and CGA and transported to/from room in [facility] dependently for time management purposes. Pt politely declined practicing stairs or car transfers this morning due to feeling ill. Pt transferred sit<>stand with quad cane and CGA and ambulated 60f with quad cane and CGA to simulate bathroom entry. Pt reported feeling "weak" and requesting therapist "hold on tight" to gait belt. Pt then c/o hot/cold flashes and feeling oncoming fever - RN notified and oral temperature taken: 98.2 degrees.  Pt requested to return to room to rest - upon returning pt's equipment delivered; therefore adjusted height of quad cane and R PFRW. Pt stood with R PFRW and supervision and ambulated 8110fwith R PFRW and supervision to bed. Sit<>supine with supervision and pt fell asleep before therapist left room. Concluded session with pt supine in bed, need within reach, and bed alarm on. 26 minutes missed of skilled physical therapy due to fatigue/illness.    Therapy Documentation Precautions:  Precautions Precautions: Fall Required Braces or Orthoses: Splint/Cast Splint/Cast: R post op dressing Restrictions Weight Bearing Restrictions: Yes RUE Weight Bearing: Non weight bearing RLE Weight Bearing: Weight bearing as tolerated LLE Weight Bearing: Weight bearing as tolerated Other Position/Activity Restrictions: NWB through hand, may use platform walker.  Therapy/Group: Individual Therapy AnAlfonse AlpersT, DPT  03/19/2022, 6:58 AM

## 2022-03-20 MED ORDER — OXYCODONE HCL 5 MG PO TABS: 10.0000 mg | ORAL_TABLET | Freq: Four times a day (QID) | ORAL | Status: DC | PRN

## 2022-03-20 NOTE — Plan of Care (Signed)
  Problem: Consults Goal: RH GENERAL PATIENT EDUCATION Description: See Patient Education module for education specifics. Outcome: Progressing Goal: Skin Care Protocol Initiated - if Braden Score 18 or less Description: If consults are not indicated, leave blank or document N/A Outcome: Progressing   Problem: RH BOWEL ELIMINATION Goal: RH STG MANAGE BOWEL WITH ASSISTANCE Description: STG Manage Bowel with Mod I Assistance. Outcome: Progressing Goal: RH STG MANAGE BOWEL W/MEDICATION W/ASSISTANCE Description: STG Manage Bowel with Medication with Mod I Assistance. Outcome: Progressing   Problem: RH SKIN INTEGRITY Goal: RH STG MAINTAIN SKIN INTEGRITY WITH ASSISTANCE Description: STG Maintain Skin Integrity With Mod I Assistance. Outcome: Progressing Goal: RH STG ABLE TO PERFORM INCISION/WOUND CARE W/ASSISTANCE Description: STG Able To Perform Incision/Wound Care With Mod I Assistance. Outcome: Progressing   Problem: RH SAFETY Goal: RH STG ADHERE TO SAFETY PRECAUTIONS W/ASSISTANCE/DEVICE Description: STG Adhere to Safety Precautions With Cues and Reminders. Outcome: Progressing Goal: RH STG DECREASED RISK OF FALL WITH ASSISTANCE Description: STG Decreased Risk of Fall With Mod I Assistance. Outcome: Progressing   Problem: RH PAIN MANAGEMENT Goal: RH STG PAIN MANAGED AT OR BELOW PT'S PAIN GOAL Description: < 3 on a 0-10 pain scale. Outcome: Progressing   Problem: RH KNOWLEDGE DEFICIT GENERAL Goal: RH STG INCREASE KNOWLEDGE OF SELF CARE AFTER HOSPITALIZATION Description: Patient will demonstrate knowledge of self-care management, medication/pain management, skin/wound care, weight bearing precautions with educational materials and handouts provided by staff independently at discharge. Outcome: Progressing   

## 2022-03-20 NOTE — Progress Notes (Signed)
Inpatient Rehabilitation Discharge Medication Review by a Pharmacist  A complete drug regimen review was completed for this patient to identify any potential clinically significant medication issues.  High Risk Drug Classes Is patient taking? Indication by Medication  Antipsychotic No   Anticoagulant No   Antibiotic No   Opioid Yes Oxycodone- pain  Antiplatelet No   Hypoglycemics/insulin No   Vasoactive Medication No   Chemotherapy No   Other Yes Methocarbamol - muscle spasms  Meloxicam - pain  Folic acid - supplement     Type of Medication Issue Identified Description of Issue Recommendation(s)  Drug Interaction(s) (clinically significant)     Duplicate Therapy     Allergy     No Medication Administration End Date     Incorrect Dose     Additional Drug Therapy Needed     Significant med changes from prior encounter (inform family/care partners about these prior to discharge).    Other       Clinically significant medication issues were identified that warrant physician communication and completion of prescribed/recommended actions by midnight of the next day:  No  Time spent performing this drug regimen review (minutes):  20 minutes   Elwin Sleight 03/20/2022 12:50 PM

## 2022-03-20 NOTE — Progress Notes (Signed)
Patient ID: Joshua Orozco, male   DOB: 1986/04/19, 36 y.o.   MRN: [id number]  Patient MATCH entered:   [id number]

## 2022-03-20 NOTE — Progress Notes (Signed)
Patient ID: Joshua Orozco, male   DOB: 1986/06/12, 36 y.o.   MRN: [id number]  Patient anticipants that his friend will pick him up on Sunday for discharge. Sw provided sw cell phone in case of any changes. No additional questions or concerns.

## 2022-03-20 NOTE — Progress Notes (Signed)
Occupational Therapy Session Note  Patient Details  Name: Joshua Orozco MRN: [id number] Date of Birth: October 07, 1985  Today's Date: 03/20/2022 OT Individual Time: 0802-0859 & 1420-1530 OT Individual Time Calculation (min): 57 min & 70 min   Short Term Goals: Week 2:  OT Short Term Goal 1 (Week 2): STG = LTG 2/2 ELOS  Skilled Therapeutic Interventions/Progress Updates:  Session 1 Skilled OT intervention completed with focus on pain management, functional endurance within a shower context and ADL retraining. Pt received upright in bed, reported feeling slightly better however still having full body aches with "all my joints hurt." Therapist clarifying overall pain, with pt stating 6/10 generalized pain with therapist encouraging a shower for pain management purposes and pt agreeable. Completed bed mobility with mod I without use of leg lifter. Completed sit > stand with supervision using R PFRW then ambulatory transfer with supervision to tub bench in shower using R PFRW, and throughout remainder of session. Therapist applied water proof cover to IV prior to shower per MD.   Completed all bathing seated with mod I without use of LH sponge 2/2 pt preference. Pt able to thread BLE into underwear/shorts without use of AE as pt desires to maximize independence without AE, then donned in stance with mod I. Donned shirt with independence. Ambulatory transfer using R PFRW with supervision to w/c, then pt completed donning of socks/shoes with figure four positioning with mod I. Mod I grooming seated at sink.   Discussed pt's plans for IADL management including meals, as pt has no family or anyone around to help him. Pt stating that he will have to figure it out at home, but therapist offered options including food delivery services like [employer] and [employer], however pt stating he does not have credit cards to utilize that services. Therapist discussed his transportation plans for d/c from the hospital, with him  stating his friend would pick him up with therapist suggesting that he go while he is out before he is taken home to pick up a stock supply of food so he is able take care of himself with pt appreciative of the idea. Pt was left seated in w/c, with all immediate needs met at end of session.  Session 2 Skilled OT intervention completed with focus on tub/shower transfer and TTB education, bathroom transfers, d/c planning. Pt received upright in bed, un-rated but low pain expressed in L hip, declined intervention needed however therapist offered rest breaks and repositioning for pain reduction. Completed bed mobility mod I, then mod I ambulatory transfer using R PFRW to bathroom for mod I toileting with pt continent of void. Transported pt dependently > ADL bathroom > back to room for time. Education provided on how to assemble the TTB that was delivered to his room with demonstration of one in tub room. Therapist offered suggestions on how to increase safety and went over strategies on how to manage moving it out of the way safely for ambulation in his narrow bathroom walking space. Reviewed shower curtain management to prevent water spillage, minimizing stands via lateral leans for pericare, as well as effective safety strategies for exiting the shower to eliminate falls. Pt able to demonstrate supervision A fading to mod I tub transfer this session with initially increased pain while using helicopter method, however pain substantially decreasing with education about modifying the demands with the "walk the feet over" technique. In hallway, pt ambulated between narrow space to simulate narrow BOS with use of quad cane in front of  him with supervision for 34 ft. Returned to room with pt agreeable to stay up in w/c for bed linens to be changed. Pt remained in w/c, made mod I in room for all tasks this date, and all needs in reach at end of session.    Therapy Documentation Precautions:   Precautions Precautions: Fall Required Braces or Orthoses: Splint/Cast Splint/Cast: R post op dressing Restrictions Weight Bearing Restrictions: Yes RUE Weight Bearing: Non weight bearing RLE Weight Bearing: Weight bearing as tolerated LLE Weight Bearing: Weight bearing as tolerated Other Position/Activity Restrictions: NWB through hand, may use platform walker.    Therapy/Group: Individual Therapy  Blase Mess, MS, OTR/L  03/20/2022, 7:27 AM

## 2022-03-20 NOTE — Progress Notes (Signed)
Physical Therapy Session Note  Patient Details  Name: Joshua Orozco MRN: [id number] Date of Birth: 01/20/1986  Today's Date: 03/20/2022 PT Individual Time: [id number] PT Individual Time Calculation (min): 23 min   Short Term Goals: Week 1:  PT Short Term Goal 1 (Week 1): Pt will transfer sup <> sit w/ CGA. PT Short Term Goal 1 - Progress (Week 1): Met PT Short Term Goal 2 (Week 1): Pt will transfer sit to stand w/ CGA PT Short Term Goal 2 - Progress (Week 1): Met PT Short Term Goal 3 (Week 1): Pt will amb w/ platform walker and CGA x 70' PT Short Term Goal 3 - Progress (Week 1): Met PT Short Term Goal 4 (Week 1): Pt will assess stairs. PT Short Term Goal 4 - Progress (Week 1): Met Week 2:  PT Short Term Goal 1 (Week 2): =LTG due to ELOS  Skilled Therapeutic Interventions/Progress Updates:   Pt received sitting in WC and agreeable to PT.   PT adjusted QC to be step for L hand use. Gait training with QC x 6f with min assist for safety and cues for step to pattern and posture.   Seated therex: LAQ with red tband on the R and AROM no the LLE. Hip abduction with manual resistance. HS curl yellow tband on the RLE eand AROM on the LLE. Noted to have cavitation on the L knee with HS curl; pt reports decrease pain under patella. All performed x 10 BLE with cues for decreased speed  [facility] and all needs met.        Therapy Documentation Precautions:  Precautions Precautions: Fall Required Braces or Orthoses: Splint/Cast Splint/Cast: R post op dressing Restrictions Weight Bearing Restrictions: Yes RUE Weight Bearing: Non weight bearing RLE Weight Bearing: Weight bearing as tolerated LLE Weight Bearing: Weight bearing as tolerated Other Position/Activity Restrictions: NWB through hand, elbow only. PFRW is allowed  Pain:  5/10 hip. Aching. See eMAR   Therapy/Group: Individual Therapy  ALorie Phenix6/30/2023, 9:33  AM
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
## 2022-03-20 NOTE — Progress Notes (Signed)
Inpatient Rehabilitation Care Coordinator Discharge Note   Patient Details  Name: Joshua Orozco MRN: [id number] Date of Birth: June 24, 1986   Discharge location: Home  Length of Stay: 18 Days  Discharge activity level: MOD I  Home/community participation: self  Patient response [id number] Literacy - How often do you need to have someone help you when you read instructions, pamphlets, or other written material from your doctor or pharmacy?: Never  Patient response [id number] Isolation - How often do you feel lonely or isolated from those around you?: Never  Services provided included: MD, RD, PT, OT, SLP, RN, CM, TR, Pharmacy, Neuropsych, SW  Financial Services:       Choices offered to/list presented to: patient  Follow-up services arranged:              Patient response to transportation need: Is the patient able to respond to transportation needs?: Yes In the past 12 months, has lack of transportation kept you from medical appointments or from getting medications?: No In the past 12 months, has lack of transportation kept you from meetings, work, or from getting things needed for daily living?: No    Comments (or additional information):  Patient/Family verbalized understanding of follow-up arrangements:  Yes  Individual responsible for coordination of the follow-up plan: self  Confirmed correct DME delivered: Andria Rhein 03/20/2022    Andria Rhein

## 2022-03-20 NOTE — Progress Notes (Signed)
Physical Therapy Session Note  Patient Details  Name: Joshua Orozco MRN: [id number] Date of Birth: 02-14-1986  Today's Date: 03/20/2022 PT Individual Time: 1103-1159 PT Individual Time Calculation (min): 56 min   Short Term Goals: Week 1:  PT Short Term Goal 1 (Week 1): Pt will transfer sup <> sit w/ CGA. PT Short Term Goal 1 - Progress (Week 1): Met PT Short Term Goal 2 (Week 1): Pt will transfer sit to stand w/ CGA PT Short Term Goal 2 - Progress (Week 1): Met PT Short Term Goal 3 (Week 1): Pt will amb w/ platform walker and CGA x 70' PT Short Term Goal 3 - Progress (Week 1): Met PT Short Term Goal 4 (Week 1): Pt will assess stairs. PT Short Term Goal 4 - Progress (Week 1): Met Week 2:  PT Short Term Goal 1 (Week 2): =LTG due to ELOS  Skilled Therapeutic Interventions/Progress Updates:   Received pt sitting in WC, pt agreeable to PT treatment, and reported pain 5/10 at rest increasing to 9/10 at end of session - pt declined any pain interventions. Session with emphasis on functional mobility/transfers, generalized strengthening and endurance, dynamic standing balance, and stair navigation. Pt transported to/from room in [facility] dependently for time management purposes. Attempted stair navigation with quad cane, however quad cane would not safely fit on step, therefore stepped down and used R PFRW instead. Pt navigated 8 steps x 1 and 4 steps x 1 with R PFRW with no physical assist but did require assist to stabilize walker and advance up/down steps. On third trial took video for pt to show friend who will be assisting with stair navigation upon D/C. Pt required cues for pursed lip breathing technique due to increase in pain levels with activity. Discussed car transfer and pt reports his friend picking him up has a truck with a high seat. Discussed how pt can take advantage of curb outside to step into truck and demonstrated technique to safely slide out of truck upon arriving home - plan to practice  tomorrow. Returned to room and performed pain interference questionnaire, sensation, and MMT. Concluded session with pt sitting in [facility] with all needs within reach and RN present at bedside. Pt made mod I in room.  Therapy Documentation Precautions:  Precautions Precautions: Fall Required Braces or Orthoses: Splint/Cast Splint/Cast: R post op dressing Restrictions Weight Bearing Restrictions: Yes RUE Weight Bearing: Non weight bearing RLE Weight Bearing: Weight bearing as tolerated LLE Weight Bearing: Weight bearing as tolerated Other Position/Activity Restrictions: NWB through hand, may use platform walker.  Therapy/Group: Individual Therapy Alfonse Alpers PT, DPT  03/20/2022, 6:56 AM

## 2022-03-20 NOTE — Progress Notes (Signed)
Occupational Therapy Discharge Summary  Patient Details  Name: Joshua Orozco MRN: [id number] Date of Birth: [birth year]   Patient has met 41 of 13 long term goals due to improved activity tolerance, improved balance, functional use of  RIGHT upper and LEFT lower extremity, improved awareness, and improved coordination.  Patient to discharge at overall Modified Independent level.  Patient is to d/c independently but will have his landlord around in case of emergency, and will have friends intermittently for assist with higher level tasks as needed.  Reasons goals not met: NA  Recommendation:  Patient will benefit from ongoing skilled OT services in outpatient setting to continue to advance functional skills in the area of iADL and for post op functional restoration of his dominant hand .  Equipment: TTB  Reasons for discharge: treatment goals met  Patient/family agrees with progress made and goals achieved: Yes  OT Discharge Precautions/Restrictions  Precautions Precautions: Fall Restrictions Weight Bearing Restrictions: Yes RUE Weight Bearing: Non weight bearing RLE Weight Bearing: Weight bearing as tolerated LLE Weight Bearing: Weight bearing as tolerated Other Position/Activity Restrictions: NWB through hand, elbow only. PFRW is allowed ADL ADL Equipment Provided: Sock aid, Reacher, Long-handled shoe horn Eating: Independent Where Assessed-Eating: Wheelchair Grooming: Modified independent Where Assessed-Grooming: Sitting at sink Upper Body Bathing: Independent Where Assessed-Upper Body Bathing: Shower Lower Body Bathing: Modified independent Where Assessed-Lower Body Bathing: Shower Upper Body Dressing: Independent Where Assessed-Upper Body Dressing: Sitting at sink Lower Body Dressing: Modified independent Where Assessed-Lower Body Dressing: Sitting at sink Toileting: Modified independent Where Assessed-Toileting: Electrical engineer Method: Counselling psychologist: Raised toilet seat Tub/Shower Transfer: Modified independent Clinical cytogeneticist Method: Ambulating, Sit pivot Tub/Shower Equipment: Facilities manager: Modified independent Social research officer, government Method: Heritage manager: Gaffer Baseline Vision/History: 0 No visual deficits Patient Visual Report: No change from baseline Vision Assessment?: No apparent visual deficits Perception  Perception: Within Functional Limits Praxis Praxis: Intact Cognition Cognition Overall Cognitive Status: Within Functional Limits for tasks assessed Arousal/Alertness: Awake/alert Orientation Level: Person;Place;Situation Person: Oriented Place: Oriented Situation: Oriented Memory: Appears intact Awareness: Appears intact Problem Solving: Appears intact Safety/Judgment: Appears intact Brief Interview for Mental Status (BIMS) Repetition of Three Words (First Attempt): 3 Temporal Orientation: Year: Correct Temporal Orientation: Month: Accurate within 5 days Temporal Orientation: Day: Correct Recall: "Sock": Yes, no cue required Recall: "Blue": Yes, no cue required Recall: "Bed": Yes, no cue required BIMS Summary Score: 15 Sensation Sensation Light Touch: Appears Intact Hot/Cold: Appears Intact Proprioception: Appears Intact Stereognosis: Appears Intact Motor  Motor Motor: Within Functional Limits Motor - Skilled Clinical Observations: generalized weakness and pain Mobility  Bed Mobility Bed Mobility: Rolling Right;Rolling Left;Supine to Sit;Sit to Supine Rolling Right: Independent with assistive device Rolling Left: Independent with assistive device Sit to Supine: Independent with assistive device Transfers Sit to Stand: Independent with assistive device Stand to Sit: Independent with assistive device  Trunk/Postural Assessment  Cervical Assessment Cervical Assessment:  Within Functional Limits Thoracic Assessment Thoracic Assessment: Within Functional Limits Lumbar Assessment Lumbar Assessment: Within Functional Limits Postural Control Postural Control: Within Functional Limits  Balance Balance Balance Assessed: Yes Static Sitting Balance Static Sitting - Balance Support: Feet supported;No upper extremity supported Static Sitting - Level of Assistance: 7: Independent Dynamic Sitting Balance Dynamic Sitting - Balance Support: During functional activity;Feet supported Dynamic Sitting - Level of Assistance: 7: Independent Static Standing Balance Static Standing - Balance Support: Bilateral upper extremity supported;During functional activity (R PFRW) Static  Standing - Level of Assistance: 6: Modified independent (Device/Increase time) Dynamic Standing Balance Dynamic Standing - Balance Support: Bilateral upper extremity supported;During functional activity (R PFRW) Dynamic Standing - Level of Assistance: 6: Modified independent (Device/Increase time) Dynamic Standing - Comments: with transfers Extremity/Trunk Assessment RUE Assessment RUE Assessment: Exceptions to WFL Active Range of Motion (AROM) Comments: wrist and hand postop splint/sutures removed, and is tolerating AROM and light strengthening at this time however still NWB in the hand; shoulder WNL General Strength Comments: Shoulder and elbow tested only: WFL LUE Assessment LUE Assessment: Within Functional Limits   Hope E Kluttz, MS, OTR/L  03/20/2022, 8:00 AM 

## 2022-03-21 NOTE — Progress Notes (Signed)
Occupational Therapy Session Note  Patient Details  Name: Joshua Orozco MRN: [id number] Date of Birth: 1986-02-21  Today's Date: 03/21/2022 OT Individual Time: 5631-4970 OT Individual Time Calculation (min): 45 min    Short Term Goals: Week 2:  OT Short Term Goal 1 (Week 2): STG = LTG 2/2 ELOS  Skilled Therapeutic Interventions/Progress Updates:    Upon OT arrival, pt semi recumbent in bed and reports 5/10 pain in groin. Pt agreeable to OT treatment session. Pt requesting to shower before his last day. Pt completes shower ADL at the levels listed below. Pt requires Min A to donn pants as pt reports "He didn't bring his platform walker with him". Pt ambulates within room and bathroom with Mod I using QC. Pt completes functional transfer using QC with Mod I. Pt making progress towards stated OT goals and continues to benefit from OT services to achieve highest level of independence. Pt left in w/c at end of session with all needs met. Interpreter present during OT session.   Therapy Documentation Precautions:  Precautions Precautions: Fall Required Braces or Orthoses: Splint/Cast Splint/Cast: R post op dressing Restrictions Weight Bearing Restrictions: Yes RUE Weight Bearing: Weight bear through elbow only RLE Weight Bearing: Weight bearing as tolerated LLE Weight Bearing: Weight bearing as tolerated Other Position/Activity Restrictions: NWB through hand, elbow only. PFRW is allowed  ADL: Grooming: Modified independent Where Assessed-Grooming: Sitting at sink Upper Body Bathing: Independent Where Assessed-Upper Body Bathing: Shower Lower Body Bathing: Modified independent Where Assessed-Lower Body Bathing: Shower Upper Body Dressing: Independent Where Assessed-Upper Body Dressing: Other (Comment) (sitting on shower chair) Lower Body Dressing: Minimal assistance (to pull up /underwear/pants over hips) Where Assessed-Lower Body Dressing: Other (Comment) (shower chair) Toileting:  Modified independent Where Assessed-Toileting: Glass blower/designer: Diplomatic Services operational officer Method: Counselling psychologist: Raised Counselling psychologist: Modified independent Social research officer, government Method: Heritage manager: Radio broadcast assistant   Therapy/Group: Individual Therapy  Marvetta Gibbons 03/21/2022, 9:57 AM

## 2022-03-21 NOTE — Progress Notes (Signed)
Physical Therapy Discharge Summary  Patient Details  Name: Joshua Orozco MRN: [id number] Date of Birth: [birth year]  Today's Date: 03/21/2022 PT Individual Time: 1423-9532 PT Individual Time Calculation (min): 39 min   Patient has met 7 of 9 long term goals due to improved activity tolerance, improved balance, improved postural control, increased strength, increased range of motion, ability to compensate for deficits, and improved coordination.  Patient to discharge at an ambulatory level Modified Independent. Pt to discharge home alone but demonstrates good understanding of impairments as well as good safety awareness. Pt will have intermittent assist from friends if needed.   Reasons goals not met: Pt did not meet stair goal of 4 steps with supervision as pt currently requires min A for R PFRW management to navigate up to 8 steps due to UE weakness, pain, and fatigue. Pt did not meet bed mobility goal of independent as pt currently requires mod I and occasional use of gait belt/leg lifter due to pain and generalized weakness.   Recommendation:  Patient will benefit from ongoing skilled PT services in outpatient setting to continue to advance safe functional mobility, address ongoing impairments in transfers, generalized strengthening and endurance, dynamic standing balance/coordination, gait training, stair navigation, and to minimize fall risk.  Equipment: R PFRW and quad cane  Reasons for discharge: treatment goals met  Patient/family agrees with progress made and goals achieved: Yes  Today's Interventions: Received pt sitting in WC brushing teeth at sink. Pt agreeable to PT treatment, and reported pain 4-5/10 in pelvis (premedicated). Interpreter present during session. Session with emphasis on discharge planning functional mobility/transfers, generalized strengthening and endurance, simulated car transfers, and gait training. Pt transported to ortho gym in [facility] dependently for time management  purposes. Sit<>stand with R PFRW and mod I and performed simulated car transfer from 42in high truck ([place]) with R PFRW and supervision x 2 trials. Pt required cues to use R elbow on back of seat and boost up into car seat as well as how to safely lower himself out of the car using UE strength. Pt then ambulated 9f on uneven surfaces (ramp) with R PFRW and supervision x 2 trials. Sit<>stand with R PFRW and mod I and pt ambulated >3043fwith R PFRW and supervision back to room. Worked on bed mobility and pt initially transferred sit<>supine and supine<>sit without leg lifter or bedrails with mod I and increased time. Pt reported increased pain levels but when performing again with leg lifter reported significantly less pain overall. Educated pt on logroll technique and pt performed bed mobility for third time using logroll technique and leg lifter with mod I. Encouraged pt to continue to remain active upon D/C and discussed transition from R PFRW<>quad cane<>no AD. Concluded session with pt supine in bed with all needs within reach.   PT Discharge Precautions/Restrictions Precautions Precautions: Fall Restrictions Weight Bearing Restrictions: Yes RUE Weight Bearing: Weight bear through elbow only RLE Weight Bearing: Weight bearing as tolerated LLE Weight Bearing: Weight bearing as tolerated Other Position/Activity Restrictions: NWB through hand, elbow only. PFRW is allowed Pain Interference Pain Interference Pain Effect on Sleep: 2. Occasionally Pain Interference with Therapy Activities: 1. Rarely or not at all Pain Interference with Day-to-Day Activities: 2. Occasionally Cognition Overall Cognitive Status: Within Functional Limits for tasks assessed Arousal/Alertness: Awake/alert Orientation Level: Oriented X4 Memory: Appears intact Awareness: Appears intact Problem Solving: Appears intact Safety/Judgment: Appears intact Sensation Sensation Light Touch: Impaired  Detail Proprioception: Appears Intact Additional Comments: pt reports numbness along LLE,  decreased sensation LLE>RLE from L3-S1 dermatomes Coordination Gross Motor Movements are Fluid and Coordinated: Yes Fine Motor Movements are Fluid and Coordinated: Yes Coordination and Movement Description: generalized weakness and pain Finger Nose Finger Test: [facility] bilaterally Heel Shin Test: slow on RLE unable to perform on LLE due to pain Motor  Motor Motor: Within Functional Limits Motor - Skilled Clinical Observations: generalized weakness and pain  Mobility Bed Mobility Bed Mobility: Rolling Right;Rolling Left;Supine to Sit;Sit to Supine Rolling Right: Independent with assistive device Rolling Left: Independent with assistive device Supine to Sit: Independent with assistive device Sit to Supine: Independent with assistive device Transfers Transfers: Sit to Stand;Stand to Sit;Stand Pivot Transfers Sit to Stand: Independent with assistive device Stand to Sit: Independent with assistive device Stand Pivot Transfers: Independent with assistive device Transfer (Assistive device): Right platform walker Locomotion  Gait Ambulation: Yes Gait Assistance: Supervision/Verbal cueing Gait Distance (Feet): 150 Feet Assistive device: Right platform walker Gait Gait: Yes Gait Pattern: Impaired Gait Pattern: Decreased step length - right;Decreased step length - left;Antalgic;Decreased stride length;Poor foot clearance - left;Poor foot clearance - right Gait velocity: decreased Stairs / Additional Locomotion Stairs: Yes Stairs Assistance: Minimal Assistance - Patient > 75% Stair Management Technique: With walker (R PFRW) Number of Stairs: 8 Height of Stairs: 6 Ramp: Supervision/Verbal cueing Wheelchair Mobility Wheelchair Mobility: No  Trunk/Postural Assessment  Cervical Assessment Cervical Assessment: Within Functional Limits Thoracic Assessment Thoracic Assessment: Within Functional  Limits Lumbar Assessment Lumbar Assessment: Within Functional Limits Postural Control Postural Control: Within Functional Limits  Balance Balance Balance Assessed: Yes Static Sitting Balance Static Sitting - Balance Support: Feet supported;No upper extremity supported Static Sitting - Level of Assistance: 7: Independent Dynamic Sitting Balance Dynamic Sitting - Balance Support: During functional activity;Feet supported Dynamic Sitting - Level of Assistance: 7: Independent Static Standing Balance Static Standing - Balance Support: Bilateral upper extremity supported;During functional activity (R PFRW) Static Standing - Level of Assistance: 6: Modified independent (Device/Increase time) Dynamic Standing Balance Dynamic Standing - Balance Support: Bilateral upper extremity supported;During functional activity (R PFRW) Dynamic Standing - Level of Assistance: 6: Modified independent (Device/Increase time) Dynamic Standing - Comments: with transfers Extremity Assessment  RLE Assessment RLE Assessment: Exceptions to [facility] RLE Strength Right Hip Flexion: 5/5 Right Hip ABduction: 2/5 Right Hip ADduction: 3/5 Right Knee Flexion: 5/5 Right Knee Extension: 5/5 Right Ankle Dorsiflexion: 5/5 Right Ankle Plantar Flexion: 5/5 LLE Assessment LLE Assessment: Exceptions to [facility] LLE Strength Left Hip Flexion: 4/5 Left Hip ABduction: 2/5 Left Hip ADduction: 3/5 Left Knee Flexion: 3/5 Left Knee Extension: 4/5 Left Ankle Dorsiflexion: 5/5 Left Ankle Plantar Flexion: 5/5  Andretta Ergle M Joylynn Defrancesco Brodin Gelpi PT, DPT  03/21/2022, 7:13 AM
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
## 2022-03-21 NOTE — Plan of Care (Signed)
  Problem: Consults Goal: RH GENERAL PATIENT EDUCATION Description: See Patient Education module for education specifics. Outcome: Progressing Goal: Skin Care Protocol Initiated - if Braden Score 18 or less Description: If consults are not indicated, leave blank or document N/A Outcome: Progressing   Problem: RH BOWEL ELIMINATION Goal: RH STG MANAGE BOWEL WITH ASSISTANCE Description: STG Manage Bowel with Mod I Assistance. Outcome: Progressing Goal: RH STG MANAGE BOWEL W/MEDICATION W/ASSISTANCE Description: STG Manage Bowel with Medication with Mod I Assistance. Outcome: Progressing   Problem: RH SKIN INTEGRITY Goal: RH STG MAINTAIN SKIN INTEGRITY WITH ASSISTANCE Description: STG Maintain Skin Integrity With Mod I Assistance. Outcome: Progressing Goal: RH STG ABLE TO PERFORM INCISION/WOUND CARE W/ASSISTANCE Description: STG Able To Perform Incision/Wound Care With Mod I Assistance. Outcome: Progressing   Problem: RH SAFETY Goal: RH STG ADHERE TO SAFETY PRECAUTIONS W/ASSISTANCE/DEVICE Description: STG Adhere to Safety Precautions With Cues and Reminders. Outcome: Progressing Goal: RH STG DECREASED RISK OF FALL WITH ASSISTANCE Description: STG Decreased Risk of Fall With Mod I Assistance. Outcome: Progressing   Problem: RH PAIN MANAGEMENT Goal: RH STG PAIN MANAGED AT OR BELOW PT'S PAIN GOAL Description: < 3 on a 0-10 pain scale. Outcome: Progressing   Problem: RH KNOWLEDGE DEFICIT GENERAL Goal: RH STG INCREASE KNOWLEDGE OF SELF CARE AFTER HOSPITALIZATION Description: Patient will demonstrate knowledge of self-care management, medication/pain management, skin/wound care, weight bearing precautions with educational materials and handouts provided by staff independently at discharge. Outcome: Progressing
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
## 2022-03-22 NOTE — Progress Notes (Signed)
Patient discharge to home, male relative came to pick up patient. Patient signed for home meds and sheet placed in chart. Cletis Media, LPN

## 2022-03-30 ENCOUNTER — Encounter: Payer: Self-pay | Attending: Registered Nurse | Admitting: Registered Nurse
